# Patient Record
Sex: Female | Born: 1945 | Race: White | Hispanic: No | Marital: Married | Smoking: Never smoker
Health system: Southern US, Community
[De-identification: ages and names within clinical notes are randomized; demographics above are authoritative.]

## PROBLEM LIST (undated history)

## (undated) ENCOUNTER — Emergency Department (HOSPITAL_COMMUNITY): Payer: Medicare Other

## (undated) DIAGNOSIS — R51 Headache: Secondary | ICD-10-CM

## (undated) DIAGNOSIS — N189 Chronic kidney disease, unspecified: Secondary | ICD-10-CM

## (undated) DIAGNOSIS — K5730 Diverticulosis of large intestine without perforation or abscess without bleeding: Secondary | ICD-10-CM

## (undated) DIAGNOSIS — M1990 Unspecified osteoarthritis, unspecified site: Secondary | ICD-10-CM

## (undated) DIAGNOSIS — G4730 Sleep apnea, unspecified: Secondary | ICD-10-CM

## (undated) DIAGNOSIS — K219 Gastro-esophageal reflux disease without esophagitis: Secondary | ICD-10-CM

## (undated) DIAGNOSIS — G709 Myoneural disorder, unspecified: Secondary | ICD-10-CM

## (undated) DIAGNOSIS — J302 Other seasonal allergic rhinitis: Secondary | ICD-10-CM

## (undated) DIAGNOSIS — Z22322 Carrier or suspected carrier of Methicillin resistant Staphylococcus aureus: Secondary | ICD-10-CM

## (undated) HISTORY — PX: EYE SURGERY: SHX253

## (undated) HISTORY — PX: BACK SURGERY: SHX140

---

## 1966-07-24 HISTORY — PX: TUBAL LIGATION: SHX77

## 1998-07-24 HISTORY — PX: CHOLECYSTECTOMY: SHX55

## 2004-07-24 HISTORY — PX: ABDOMINAL HYSTERECTOMY: SHX81

## 2007-07-25 DIAGNOSIS — Z22322 Carrier or suspected carrier of Methicillin resistant Staphylococcus aureus: Secondary | ICD-10-CM | POA: Insufficient documentation

## 2007-07-25 HISTORY — PX: OTHER SURGICAL HISTORY: SHX169

## 2007-07-25 HISTORY — PX: COLON SURGERY: SHX602

## 2007-07-25 HISTORY — DX: Carrier or suspected carrier of methicillin resistant Staphylococcus aureus: Z22.322

## 2008-07-24 HISTORY — PX: COLOSTOMY CLOSURE: SHX1381

## 2008-07-24 HISTORY — PX: CERVICAL DISCECTOMY: SHX98

## 2009-04-19 ENCOUNTER — Inpatient Hospital Stay (HOSPITAL_COMMUNITY): Admission: RE | Admit: 2009-04-19 | Discharge: 2009-04-20 | Payer: Self-pay | Admitting: Neurosurgery

## 2009-06-15 ENCOUNTER — Encounter: Admission: RE | Admit: 2009-06-15 | Discharge: 2009-06-15 | Payer: Self-pay | Admitting: Neurosurgery

## 2009-12-22 HISTORY — PX: HERNIA REPAIR: SHX51

## 2010-07-24 HISTORY — PX: HERNIA REPAIR: SHX51

## 2010-10-28 LAB — CBC
MCV: 85.4 fL (ref 78.0–100.0)
Platelets: 294 10*3/uL (ref 150–400)
RBC: 4.1 MIL/uL (ref 3.87–5.11)
WBC: 8.5 10*3/uL (ref 4.0–10.5)

## 2010-10-28 LAB — DIFFERENTIAL
Eosinophils Absolute: 0.2 10*3/uL (ref 0.0–0.7)
Lymphocytes Relative: 29 % (ref 12–46)
Lymphs Abs: 2.4 10*3/uL (ref 0.7–4.0)
Monocytes Relative: 5 % (ref 3–12)
Neutro Abs: 5.5 10*3/uL (ref 1.7–7.7)
Neutrophils Relative %: 65 % (ref 43–77)

## 2010-10-28 LAB — ABO/RH: ABO/RH(D): A POS

## 2010-10-28 LAB — TYPE AND SCREEN: Antibody Screen: NEGATIVE

## 2011-07-28 ENCOUNTER — Encounter (HOSPITAL_COMMUNITY): Payer: Self-pay

## 2011-08-07 NOTE — Pre-Procedure Instructions (Signed)
20 Chelsea Gregory  08/07/2011   Your procedure is scheduled on: Monday, January 21 st.t  Report to [facility] at 9:00AM.   Call this number if you have problems the morning of surgery: [phone]   Remember:   Do not eat food:After Midnight.   May have clear liquids: up to 4 Hours before arrival. 5:00am  Clear liquids include soda, tea, black coffee, apple or grape juice, broth.  Take these medicines the morning of surgery with A SIP OF WATER:  Hydrocodone- Acetaminophen (Norco), Nitrofurantoin (Marodantin), Omeprazole (Prilosec)   Do not wear jewelry, make-up or nail polish.  Do not wear lotions, powders, or perfumes. You may wear deodorant.  Do not shave 48 hours prior to surgery.  Do not bring valuables to the hospital.  Contacts, dentures or bridgework may not be worn into surgery.  Leave suitcase in the car. After surgery it may be brought to your room.  For patients admitted to the hospital, checkout time is 11:00 AM the day of discharge.   Patients discharged the day of surgery will not be allowed to drive home.  Name and phone number of your driver: --  Special Instructions: CHG Shower Use Special Wash: 1/2 bottle night before surgery and 1/2 bottle morning of surgery.   Please read over the following fact sheets that you were given: Pain Booklet, Coughing and Deep Breathing, MRSA Information and Surgical Site Infection Prevention

## 2011-08-08 ENCOUNTER — Encounter (HOSPITAL_COMMUNITY)
Admission: RE | Admit: 2011-08-08 | Discharge: 2011-08-08 | Disposition: A | Discharge status: Home / Self Care | Payer: Medicare Other | Source: Ambulatory Visit | Attending: Neurosurgery | Admitting: Neurosurgery

## 2011-08-08 ENCOUNTER — Encounter (HOSPITAL_COMMUNITY): Payer: Self-pay

## 2011-08-08 HISTORY — DX: Other seasonal allergic rhinitis: J30.2

## 2011-08-08 HISTORY — DX: Carrier or suspected carrier of methicillin resistant Staphylococcus aureus: Z22.322

## 2011-08-08 HISTORY — DX: Headache: R51

## 2011-08-08 HISTORY — DX: Myoneural disorder, unspecified: G70.9

## 2011-08-08 HISTORY — DX: Diverticulosis of large intestine without perforation or abscess without bleeding: K57.30

## 2011-08-08 HISTORY — DX: Chronic kidney disease, unspecified: N18.9

## 2011-08-08 HISTORY — DX: Sleep apnea, unspecified: G47.30

## 2011-08-08 HISTORY — DX: Gastro-esophageal reflux disease without esophagitis: K21.9

## 2011-08-08 HISTORY — DX: Unspecified osteoarthritis, unspecified site: M19.90

## 2011-08-08 LAB — SURGICAL PCR SCREEN
MRSA, PCR: NEGATIVE
Staphylococcus aureus: NEGATIVE

## 2011-08-08 LAB — CBC
HCT: 37.1 % (ref 36.0–46.0)
Hemoglobin: 11.7 g/dL — ABNORMAL LOW (ref 12.0–15.0)
RBC: 4.41 MIL/uL (ref 3.87–5.11)
WBC: 8.4 10*3/uL (ref 4.0–10.5)

## 2011-08-11 NOTE — Progress Notes (Signed)
REQUESTED ORDERS

## 2011-08-14 ENCOUNTER — Encounter (HOSPITAL_COMMUNITY)
Admission: RE | Disposition: A | Discharge status: Home / Self Care | Payer: Self-pay | Source: Ambulatory Visit | Attending: Neurosurgery

## 2011-08-14 ENCOUNTER — Encounter (HOSPITAL_COMMUNITY): Payer: Self-pay | Admitting: *Deleted

## 2011-08-14 ENCOUNTER — Encounter (HOSPITAL_COMMUNITY): Payer: Self-pay | Admitting: Neurosurgery

## 2011-08-14 ENCOUNTER — Inpatient Hospital Stay (HOSPITAL_COMMUNITY)
Admission: RE | Admit: 2011-08-14 | Discharge: 2011-08-15 | DRG: 491 | Disposition: A | Discharge status: Home / Self Care | Payer: Medicare Other | Source: Ambulatory Visit | Attending: Neurosurgery | Admitting: Neurosurgery

## 2011-08-14 ENCOUNTER — Ambulatory Visit (HOSPITAL_COMMUNITY): Payer: Medicare Other

## 2011-08-14 ENCOUNTER — Ambulatory Visit (HOSPITAL_COMMUNITY): Payer: Medicare Other | Admitting: *Deleted

## 2011-08-14 DIAGNOSIS — Z8719 Personal history of other diseases of the digestive system: Secondary | ICD-10-CM

## 2011-08-14 DIAGNOSIS — Z888 Allergy status to other drugs, medicaments and biological substances status: Secondary | ICD-10-CM

## 2011-08-14 DIAGNOSIS — G4730 Sleep apnea, unspecified: Secondary | ICD-10-CM | POA: Diagnosis present

## 2011-08-14 DIAGNOSIS — G709 Myoneural disorder, unspecified: Secondary | ICD-10-CM | POA: Diagnosis present

## 2011-08-14 DIAGNOSIS — M47817 Spondylosis without myelopathy or radiculopathy, lumbosacral region: Secondary | ICD-10-CM | POA: Diagnosis present

## 2011-08-14 DIAGNOSIS — M48061 Spinal stenosis, lumbar region without neurogenic claudication: Secondary | ICD-10-CM | POA: Insufficient documentation

## 2011-08-14 DIAGNOSIS — Z90710 Acquired absence of both cervix and uterus: Secondary | ICD-10-CM

## 2011-08-14 DIAGNOSIS — K219 Gastro-esophageal reflux disease without esophagitis: Secondary | ICD-10-CM | POA: Diagnosis present

## 2011-08-14 DIAGNOSIS — N189 Chronic kidney disease, unspecified: Secondary | ICD-10-CM | POA: Diagnosis present

## 2011-08-14 DIAGNOSIS — M48062 Spinal stenosis, lumbar region with neurogenic claudication: Principal | ICD-10-CM | POA: Diagnosis present

## 2011-08-14 DIAGNOSIS — Z882 Allergy status to sulfonamides status: Secondary | ICD-10-CM

## 2011-08-14 DIAGNOSIS — J301 Allergic rhinitis due to pollen: Secondary | ICD-10-CM | POA: Diagnosis present

## 2011-08-14 HISTORY — DX: Spinal stenosis, lumbar region with neurogenic claudication: M48.062

## 2011-08-14 HISTORY — PX: LUMBAR LAMINECTOMY/DECOMPRESSION MICRODISCECTOMY: SHX5026

## 2011-08-14 HISTORY — DX: Spinal stenosis, lumbar region without neurogenic claudication: M48.061

## 2011-08-14 LAB — DIFFERENTIAL
Basophils Absolute: 0 10*3/uL (ref 0.0–0.1)
Eosinophils Absolute: 0.1 10*3/uL (ref 0.0–0.7)
Eosinophils Relative: 2 % (ref 0–5)

## 2011-08-14 LAB — TYPE AND SCREEN: ABO/RH(D): A POS

## 2011-08-14 SURGERY — LUMBAR LAMINECTOMY/DECOMPRESSION MICRODISCECTOMY
Anesthesia: General | Site: Back | Laterality: Left | Wound class: Clean

## 2011-08-14 MED ORDER — HYDROMORPHONE HCL PF 1 MG/ML IJ SOLN
INTRAMUSCULAR | Status: AC
  Filled 2011-08-14: qty 1

## 2011-08-14 MED ORDER — SODIUM CHLORIDE 0.9 % IJ SOLN
3.0000 mL | INTRAMUSCULAR | Status: DC | PRN
  Administered 2011-08-14: 3 mL via INTRAVENOUS

## 2011-08-14 MED ORDER — CYCLOBENZAPRINE HCL 10 MG PO TABS
10.0000 mg | ORAL_TABLET | Freq: Three times a day (TID) | ORAL | Status: DC | PRN
  Administered 2011-08-14: 10 mg via ORAL
  Filled 2011-08-14: qty 1

## 2011-08-14 MED ORDER — FENTANYL CITRATE 0.05 MG/ML IJ SOLN
INTRAMUSCULAR | Status: DC | PRN
  Administered 2011-08-14 (×2): 100 ug via INTRAVENOUS
  Administered 2011-08-14: 50 ug via INTRAVENOUS

## 2011-08-14 MED ORDER — SODIUM CHLORIDE 0.9 % IV SOLN
INTRAVENOUS | Status: AC
  Filled 2011-08-14: qty 500

## 2011-08-14 MED ORDER — THERA M PLUS PO TABS
1.0000 | ORAL_TABLET | Freq: Every day | ORAL | Status: DC
  Administered 2011-08-14 – 2011-08-15 (×2): 1 via ORAL
  Filled 2011-08-14 (×2): qty 1

## 2011-08-14 MED ORDER — ROCURONIUM BROMIDE 100 MG/10ML IV SOLN
INTRAVENOUS | Status: DC | PRN
  Administered 2011-08-14: 50 mg via INTRAVENOUS

## 2011-08-14 MED ORDER — LIDOCAINE HCL (CARDIAC) 20 MG/ML IV SOLN
INTRAVENOUS | Status: DC | PRN
  Administered 2011-08-14: 40 mg via INTRAVENOUS

## 2011-08-14 MED ORDER — HYDROMORPHONE HCL PF 1 MG/ML IJ SOLN: 0.5000 mg | INTRAMUSCULAR | Status: DC | PRN

## 2011-08-14 MED ORDER — ONDANSETRON HCL 4 MG/2ML IJ SOLN: 4.0000 mg | INTRAMUSCULAR | Status: DC | PRN

## 2011-08-14 MED ORDER — HYDROCODONE-ACETAMINOPHEN 5-325 MG PO TABS: 1.0000 | ORAL_TABLET | ORAL | Status: DC | PRN

## 2011-08-14 MED ORDER — MIDAZOLAM HCL 5 MG/5ML IJ SOLN
INTRAMUSCULAR | Status: DC | PRN
  Administered 2011-08-14: 2 mg via INTRAVENOUS

## 2011-08-14 MED ORDER — HEMOSTATIC AGENTS (NO CHARGE) OPTIME
TOPICAL | Status: DC | PRN
  Administered 2011-08-14: 1 via TOPICAL

## 2011-08-14 MED ORDER — CEFAZOLIN SODIUM 1-5 GM-% IV SOLN
1.0000 g | Freq: Three times a day (TID) | INTRAVENOUS | Status: AC
  Administered 2011-08-14 (×2): 1 g via INTRAVENOUS
  Filled 2011-08-14 (×4): qty 50

## 2011-08-14 MED ORDER — SODIUM CHLORIDE 0.9 % IR SOLN
Status: DC | PRN
  Administered 2011-08-14: 13:00:00

## 2011-08-14 MED ORDER — PANTOPRAZOLE SODIUM 40 MG PO TBEC
40.0000 mg | DELAYED_RELEASE_TABLET | Freq: Every day | ORAL | Status: DC
  Administered 2011-08-15: 40 mg via ORAL
  Filled 2011-08-14: qty 1

## 2011-08-14 MED ORDER — SODIUM CHLORIDE 0.9 % IJ SOLN
3.0000 mL | Freq: Two times a day (BID) | INTRAMUSCULAR | Status: DC
  Administered 2011-08-14: 3 mL via INTRAVENOUS

## 2011-08-14 MED ORDER — CEFAZOLIN SODIUM 1-5 GM-% IV SOLN
1.0000 g | INTRAVENOUS | Status: AC
  Administered 2011-08-14: 1 g via INTRAVENOUS
  Filled 2011-08-14: qty 50

## 2011-08-14 MED ORDER — PHENOL 1.4 % MT LIQD: 1.0000 | OROMUCOSAL | Status: DC | PRN

## 2011-08-14 MED ORDER — ALUM & MAG HYDROXIDE-SIMETH 200-200-20 MG/5ML PO SUSP: 30.0000 mL | Freq: Four times a day (QID) | ORAL | Status: DC | PRN

## 2011-08-14 MED ORDER — BACITRACIN 50000 UNITS IM SOLR
INTRAMUSCULAR | Status: AC
  Filled 2011-08-14: qty 1

## 2011-08-14 MED ORDER — ACETAMINOPHEN 650 MG RE SUPP: 650.0000 mg | RECTAL | Status: DC | PRN

## 2011-08-14 MED ORDER — ALENDRONATE SODIUM 70 MG PO TABS: 70.0000 mg | ORAL_TABLET | ORAL | Status: DC

## 2011-08-14 MED ORDER — VITAMIN D 1000 UNITS PO TABS
3000.0000 [IU] | ORAL_TABLET | Freq: Every day | ORAL | Status: DC
  Administered 2011-08-14 – 2011-08-15 (×2): 3000 [IU] via ORAL
  Filled 2011-08-14 (×2): qty 3

## 2011-08-14 MED ORDER — PHENYLEPHRINE HCL 10 MG/ML IJ SOLN
INTRAMUSCULAR | Status: DC | PRN
  Administered 2011-08-14 (×2): 80 ug via INTRAVENOUS
  Administered 2011-08-14 (×4): 40 ug via INTRAVENOUS

## 2011-08-14 MED ORDER — ONDANSETRON HCL 4 MG/2ML IJ SOLN: 4.0000 mg | Freq: Once | INTRAMUSCULAR | Status: DC | PRN

## 2011-08-14 MED ORDER — ALIGN PO CAPS: 1.0000 | ORAL_CAPSULE | Freq: Every day | ORAL | Status: DC

## 2011-08-14 MED ORDER — 0.9 % SODIUM CHLORIDE (POUR BTL) OPTIME
TOPICAL | Status: DC | PRN
  Administered 2011-08-14: 1000 mL

## 2011-08-14 MED ORDER — MENTHOL 3 MG MT LOZG: 1.0000 | LOZENGE | OROMUCOSAL | Status: DC | PRN

## 2011-08-14 MED ORDER — HYDROMORPHONE HCL PF 1 MG/ML IJ SOLN
0.2500 mg | INTRAMUSCULAR | Status: DC | PRN
  Administered 2011-08-14: 0.5 mg via INTRAVENOUS
  Administered 2011-08-14 (×4): 0.25 mg via INTRAVENOUS

## 2011-08-14 MED ORDER — THROMBIN 5000 UNITS EX KIT
PACK | CUTANEOUS | Status: DC | PRN
  Administered 2011-08-14 (×2): 5000 [IU] via TOPICAL

## 2011-08-14 MED ORDER — LACTATED RINGERS IV SOLN
INTRAVENOUS | Status: DC | PRN
  Administered 2011-08-14 (×2): via INTRAVENOUS

## 2011-08-14 MED ORDER — BUPIVACAINE HCL (PF) 0.25 % IJ SOLN
INTRAMUSCULAR | Status: DC | PRN
  Administered 2011-08-14: 30 mL

## 2011-08-14 MED ORDER — ACETAMINOPHEN 325 MG PO TABS: 650.0000 mg | ORAL_TABLET | ORAL | Status: DC | PRN

## 2011-08-14 MED ORDER — OXYCODONE-ACETAMINOPHEN 5-325 MG PO TABS
1.0000 | ORAL_TABLET | ORAL | Status: DC | PRN
  Administered 2011-08-14 – 2011-08-15 (×3): 2 via ORAL
  Filled 2011-08-14 (×3): qty 2

## 2011-08-14 MED ORDER — SODIUM CHLORIDE 0.9 % IV SOLN: 250.0000 mL | INTRAVENOUS | Status: DC

## 2011-08-14 MED ORDER — PROPOFOL 10 MG/ML IV EMUL
INTRAVENOUS | Status: DC | PRN
  Administered 2011-08-14: 200 mg via INTRAVENOUS

## 2011-08-14 MED ORDER — FAMOTIDINE 20 MG PO TABS
20.0000 mg | ORAL_TABLET | Freq: Every day | ORAL | Status: DC
  Administered 2011-08-14: 20 mg via ORAL
  Filled 2011-08-14 (×2): qty 1

## 2011-08-14 MED ORDER — NITROFURANTOIN MACROCRYSTAL 50 MG PO CAPS
50.0000 mg | ORAL_CAPSULE | Freq: Every day | ORAL | Status: DC
Start: 2011-08-14 — End: 2011-08-15
  Administered 2011-08-14: 50 mg via ORAL
  Filled 2011-08-14 (×2): qty 1

## 2011-08-14 MED ORDER — VITAMIN D3 25 MCG (1000 UNIT) PO TABS: 1000.0000 [IU] | ORAL_TABLET | Freq: Every day | ORAL | Status: DC

## 2011-08-14 MED ORDER — DEXAMETHASONE SODIUM PHOSPHATE 10 MG/ML IJ SOLN
10.0000 mg | Freq: Once | INTRAMUSCULAR | Status: AC
  Administered 2011-08-14: 10 mg via INTRAVENOUS
  Filled 2011-08-14: qty 1

## 2011-08-14 MED ORDER — NEOSTIGMINE METHYLSULFATE 1 MG/ML IJ SOLN
INTRAMUSCULAR | Status: DC | PRN
  Administered 2011-08-14: 4 mg via INTRAVENOUS

## 2011-08-14 MED ORDER — ONDANSETRON HCL 4 MG/2ML IJ SOLN
INTRAMUSCULAR | Status: DC | PRN
  Administered 2011-08-14: 4 mg via INTRAVENOUS

## 2011-08-14 MED ORDER — SACCHAROMYCES BOULARDII 250 MG PO CAPS
250.0000 mg | ORAL_CAPSULE | Freq: Two times a day (BID) | ORAL | Status: DC
  Administered 2011-08-14 – 2011-08-15 (×2): 250 mg via ORAL
  Filled 2011-08-14 (×3): qty 1

## 2011-08-14 MED ORDER — GLYCOPYRROLATE 0.2 MG/ML IJ SOLN
INTRAMUSCULAR | Status: DC | PRN
  Administered 2011-08-14: .7 mg via INTRAVENOUS

## 2011-08-14 MED ORDER — LORATADINE 10 MG PO TABS
10.0000 mg | ORAL_TABLET | Freq: Every day | ORAL | Status: DC
  Filled 2011-08-14 (×2): qty 1

## 2011-08-14 SURGICAL SUPPLY — 51 items
BAG DECANTER FOR FLEXI CONT (MISCELLANEOUS) ×2 IMPLANT
BENZOIN TINCTURE PRP APPL 2/3 (GAUZE/BANDAGES/DRESSINGS) ×2 IMPLANT
BLADE SURG ROTATE 9660 (MISCELLANEOUS) IMPLANT
BRUSH SCRUB EZ PLAIN DRY (MISCELLANEOUS) ×2 IMPLANT
BUR CUTTER 7.0 ROUND (BURR) ×2 IMPLANT
CANISTER SUCTION 2500CC (MISCELLANEOUS) ×2 IMPLANT
CLOTH BEACON ORANGE TIMEOUT ST (SAFETY) ×2 IMPLANT
CONT SPEC 4OZ CLIKSEAL STRL BL (MISCELLANEOUS) ×2 IMPLANT
DECANTER SPIKE VIAL GLASS SM (MISCELLANEOUS) ×2 IMPLANT
DERMABOND ADVANCED (GAUZE/BANDAGES/DRESSINGS) ×1
DERMABOND ADVANCED .7 DNX12 (GAUZE/BANDAGES/DRESSINGS) ×1 IMPLANT
DRAPE LAPAROTOMY 100X72X124 (DRAPES) ×2 IMPLANT
DRAPE MICROSCOPE ZEISS OPMI (DRAPES) IMPLANT
DRAPE POUCH INSTRU U-SHP 10X18 (DRAPES) ×2 IMPLANT
DRAPE PROXIMA HALF (DRAPES) IMPLANT
DRAPE SURG 17X23 STRL (DRAPES) ×4 IMPLANT
ELECT REM PT RETURN 9FT ADLT (ELECTROSURGICAL) ×2
ELECTRODE REM PT RTRN 9FT ADLT (ELECTROSURGICAL) ×1 IMPLANT
GAUZE SPONGE 4X4 16PLY XRAY LF (GAUZE/BANDAGES/DRESSINGS) IMPLANT
GLOVE BIOGEL PI IND STRL 7.0 (GLOVE) ×1 IMPLANT
GLOVE BIOGEL PI IND STRL 8 (GLOVE) ×1 IMPLANT
GLOVE BIOGEL PI INDICATOR 7.0 (GLOVE) ×1
GLOVE BIOGEL PI INDICATOR 8 (GLOVE) ×1
GLOVE ECLIPSE 7.5 STRL STRAW (GLOVE) ×4 IMPLANT
GLOVE ECLIPSE 8.5 STRL (GLOVE) ×2 IMPLANT
GLOVE EXAM NITRILE LRG STRL (GLOVE) IMPLANT
GLOVE EXAM NITRILE MD LF STRL (GLOVE) ×2 IMPLANT
GLOVE EXAM NITRILE XL STR (GLOVE) IMPLANT
GLOVE EXAM NITRILE XS STR PU (GLOVE) IMPLANT
GLOVE INDICATOR 7.5 STRL GRN (GLOVE) ×2 IMPLANT
GLOVE SURG SS PI 6.5 STRL IVOR (GLOVE) ×4 IMPLANT
GOWN BRE IMP SLV AUR LG STRL (GOWN DISPOSABLE) IMPLANT
GOWN BRE IMP SLV AUR XL STRL (GOWN DISPOSABLE) ×6 IMPLANT
GOWN STRL REIN 2XL LVL4 (GOWN DISPOSABLE) ×2 IMPLANT
KIT BASIN OR (CUSTOM PROCEDURE TRAY) ×2 IMPLANT
KIT ROOM TURNOVER OR (KITS) ×2 IMPLANT
NEEDLE HYPO 22GX1.5 SAFETY (NEEDLE) ×2 IMPLANT
NEEDLE SPNL 22GX3.5 QUINCKE BK (NEEDLE) ×2 IMPLANT
NS IRRIG 1000ML POUR BTL (IV SOLUTION) ×2 IMPLANT
PACK LAMINECTOMY NEURO (CUSTOM PROCEDURE TRAY) ×2 IMPLANT
PAD ARMBOARD 7.5X6 YLW CONV (MISCELLANEOUS) ×6 IMPLANT
RUBBERBAND STERILE (MISCELLANEOUS) ×4 IMPLANT
SPONGE GAUZE 4X4 12PLY (GAUZE/BANDAGES/DRESSINGS) ×2 IMPLANT
SPONGE SURGIFOAM ABS GEL SZ50 (HEMOSTASIS) ×2 IMPLANT
STRIP CLOSURE SKIN 1/2X4 (GAUZE/BANDAGES/DRESSINGS) ×2 IMPLANT
SUT VIC AB 2-0 CT1 18 (SUTURE) ×2 IMPLANT
SUT VIC AB 3-0 SH 8-18 (SUTURE) ×2 IMPLANT
SYR 20ML ECCENTRIC (SYRINGE) ×2 IMPLANT
TOWEL OR 17X24 6PK STRL BLUE (TOWEL DISPOSABLE) ×2 IMPLANT
TOWEL OR 17X26 10 PK STRL BLUE (TOWEL DISPOSABLE) ×2 IMPLANT
WATER STERILE IRR 1000ML POUR (IV SOLUTION) ×2 IMPLANT

## 2011-08-14 NOTE — Progress Notes (Signed)

## 2011-08-14 NOTE — Anesthesia Postprocedure Evaluation (Signed)
  Anesthesia Post-op Note  Patient: Chelsea Gregory  Procedure(s) Performed:  LUMBAR LAMINECTOMY/DECOMPRESSION MICRODISCECTOMY - Left Lumbar three-four, Bilateral Lumbar four-five decompressive lumbar laminectomy  Patient Location: PACU  Anesthesia Type: General  Level of Consciousness: oriented, sedated and patient cooperative  Airway and Oxygen Therapy: Patient Spontanous Breathing and Patient connected to nasal cannula oxygen  Post-op Pain: mild  Post-op Assessment: Post-op Vital signs reviewed, Patient's Cardiovascular Status Stable, Respiratory Function Stable, Patent Airway, No signs of Nausea or vomiting and Pain level controlled  Post-op Vital Signs: stable  Complications: No apparent anesthesia complications

## 2011-08-14 NOTE — Anesthesia Preprocedure Evaluation (Addendum)
Anesthesia Evaluation  Patient identified by MRN, date of birth, ID band Patient awake    Reviewed: Allergy & Precautions, H&P , NPO status , Patient's Chart, lab work & pertinent test results  History of Anesthesia Complications (+) AWARENESS UNDER ANESTHESIA  Airway Mallampati: II TM Distance: >3 FB Neck ROM: full    Dental  (+) Teeth Intact   Pulmonary sleep apnea ,          Cardiovascular Exercise Tolerance: Good regular Normal    Neuro/Psych  Headaches,  Neuromuscular disease Negative Psych ROS   GI/Hepatic Neg liver ROS, GERD-  ,  Endo/Other  Negative Endocrine ROS  Renal/GU negative Renal ROS  Genitourinary negative   Musculoskeletal   Abdominal   Peds  Hematology negative hematology ROS (+)   Anesthesia Other Findings   Reproductive/Obstetrics                          Anesthesia Physical Anesthesia Plan  ASA: II  Anesthesia Plan: General   Post-op Pain Management:    Induction: Intravenous  Airway Management Planned: Oral ETT  Additional Equipment:   Intra-op Plan:   Post-operative Plan: Extubation in OR  Informed Consent: I have reviewed the patients History and Physical, chart, labs and discussed the procedure including the risks, benefits and alternatives for the proposed anesthesia with the patient or authorized representative who has indicated his/her understanding and acceptance.     Plan Discussed with: CRNA, Anesthesiologist and Surgeon  Anesthesia Plan Comments:        Anesthesia Quick Evaluation

## 2011-08-14 NOTE — Transfer of Care (Signed)
Immediate Anesthesia Transfer of Care Note  Patient: Chelsea Gregory  Procedure(s) Performed:  LUMBAR LAMINECTOMY/DECOMPRESSION MICRODISCECTOMY - Left Lumbar three-four, Bilateral Lumbar four-five decompressive lumbar laminectomy  Patient Location: PACU  Anesthesia Type: General  Level of Consciousness: awake and alert   Airway & Oxygen Therapy: Patient Spontanous Breathing and Patient connected to nasal cannula oxygen  Post-op Assessment: Report given to PACU RN and Post -op Vital signs reviewed and stable  Post vital signs: Reviewed and stable  Complications: No apparent anesthesia complications

## 2011-08-14 NOTE — H&P (Signed)
Chelsea Gregory is an 66 y.o. female.   Chief Complaint: Back and bilateral lower extremity pain. HPI: 66 year old female with symptoms of back pain with radiation into her left and right lower extremities. Left lower extremity pain has been an ongoing problem for many months. Workup has demonstrated evidence of significant stenosis off to the left-sided L3-4. I she's been management anti-inflammatories she is rest activity modifications and epidural steroid injections that improvement. She began having right-sided L5 refer symptoms as well. Workup here demonstrates evidence of worsening stenosis bilaterally at L4-5. Patient presents now for left-sided L3-4 decompressive laminotomy and bilateral L4-5 decompressive laminotomies.  Past Medical History  Diagnosis Date  . Seasonal allergies   . Arthritis     Knees  . GERD (gastroesophageal reflux disease)   . Neuromuscular disorder     Cervical disc  . Sleep apnea     wear CPAP  tested > 3 years ago  . Headache     Spinal -post injestions  . Chronic kidney disease     kidney infection post colon surgery on Macrodantin  . Diverticular disease of left colon   . MRSA colonization 2009    Past Surgical History  Procedure Date  . Tubal ligation 1968  . Cholecystectomy 2000  . Abdominal hysterectomy 2006  . 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 Colosotmy 2009  . Vasectomy reversal   . Colostomy closure 2010  . Cervical discectomy 2010  . Hernia repair 12/2009    Right  . Hernia repair 2012    Left  . Eye surgery     Implant to tear duct    Family History  Problem Relation Age of Onset  . Anesthesia problems Neg Hx    Social History:  reports that she has never smoked. She does not have any smokeless tobacco history on file. She reports that she does not drink alcohol or use illicit drugs.  Allergies:  Allergies  Allergen Reactions  . Celebrex (Celecoxib) Other (See Comments)      Increase in blood pressure.  . Levaquin Nausea Only  . Phenergan Other (See Comments)    Right muscle spasm.  . Sulfa Drugs Cross Reactors Other (See Comments)    Hallucinations.  . Ciprofloxacin Rash  . Macrobid Rash    Medications Prior to Admission  Medication Dose Route Frequency Provider Last Rate Last Dose  . bacitracin 16109 UNITS injection           . ceFAZolin (ANCEF) IVPB 1 g/50 mL premix  1 g Intravenous 60 min Pre-Op Temple Pacini, MD      . dexamethasone (DECADRON) injection 10 mg  10 mg Intravenous Once Temple Pacini, MD      . HYDROmorphone (DILAUDID) injection 0.25-0.5 mg  0.25-0.5 mg Intravenous Q5 min PRN Rivka Barbara, MD      . ondansetron [facility]) injection 4 mg  4 mg Intravenous Once PRN Rivka Barbara, MD      . sodium chloride 0.9 % infusion            No current outpatient prescriptions on file as of 08/14/2011.    Results for orders placed during the hospital encounter of 08/14/11 (from the past 48 hour(s))  DIFFERENTIAL     Status: Normal   Collection Time   08/14/11  9:05 AM      Component Value Range Comment   Neutrophils Relative 59  43 -  77 (%)    Neutro Abs 3.9  1.7 - 7.7 (K/uL)    Lymphocytes Relative 33  12 - 46 (%)    Lymphs Abs 2.1  0.7 - 4.0 (K/uL)    Monocytes Relative 6  3 - 12 (%)    Monocytes Absolute 0.4  0.1 - 1.0 (K/uL)    Eosinophils Relative 2  0 - 5 (%)    Eosinophils Absolute 0.1  0.0 - 0.7 (K/uL)    Basophils Relative 1  0 - 1 (%)    Basophils Absolute 0.0  0.0 - 0.1 (K/uL)   TYPE AND SCREEN     Status: Normal   Collection Time   08/14/11  9:09 AM      Component Value Range Comment   ABO/RH(D) A POS      Antibody Screen NEG      Sample Expiration 08/17/2011      No results found.  Review of Systems  Constitutional: Negative.   HENT: Negative.   Eyes: Negative.   Respiratory: Negative.   Cardiovascular: Negative.   Gastrointestinal: Negative.   Genitourinary: Negative.   Musculoskeletal: Negative.    Skin: Negative.   Neurological: Negative.   Endo/Heme/Allergies: Negative.   Psychiatric/Behavioral: Negative.   All other systems reviewed and are negative.    Blood pressure 147/81, pulse 86, temperature 98 F (36.7 C), temperature source Oral, resp. rate 18, SpO2 99.00%. Physical Exam  Constitutional: She is oriented to person, place, and time. She appears well-developed and well-nourished.  HENT:  Head: Normocephalic and atraumatic.  Right Ear: External ear normal.  Left Ear: External ear normal.  Eyes: Conjunctivae and EOM are normal. Pupils are equal, round, and reactive to light.  Neck: Normal range of motion. Neck supple. No tracheal deviation present. No thyromegaly present.  Cardiovascular: Normal rate.   Respiratory: Effort normal. No respiratory distress. She has no wheezes.  GI: Soft. Bowel sounds are normal. She exhibits no distension. There is no tenderness.  Musculoskeletal: Normal range of motion. She exhibits no edema and no tenderness.  Neurological: She is alert and oriented to person, place, and time. She has normal reflexes. She displays normal reflexes. No cranial nerve deficit. She exhibits normal muscle tone. Coordination normal.  Skin: Skin is warm and dry. No rash noted. No erythema. No pallor.  Psychiatric: She has a normal mood and affect. Her behavior is normal. Judgment and thought content normal.     Assessment/Plan Lumbar stenosis with neurogenic claudication. Plan left L3-4 decompressive laminotomy and foraminotomies and bilateral L4-5 decompressive laminotomies and foraminotomies. Risks and benefits explained patient wishes proceed.  , A 08/14/2011, 11:27 AM

## 2011-08-14 NOTE — Preoperative (Signed)
Beta Blockers   Reason not to administer Beta Blockers:Not Applicable 

## 2011-08-14 NOTE — Brief Op Note (Signed)
08/14/2011  1:28 PM  PATIENT:  Chelsea Gregory  66 y.o. female  PRE-OPERATIVE DIAGNOSIS:  Lumbar three-four, four-five stenosis  POST-OPERATIVE DIAGNOSIS:  Lumbar three-four, four-five stenosis  PROCEDURE:  Procedure(s): LUMBAR LAMINECTOMY/DECOMPRESSION MICRODISCECTOMY  SURGEON:  Surgeon(s): Temple Pacini, MD Hewitt Shorts, MD  PHYSICIAN ASSISTANT:   ASSISTANTS: Shirlean Kelly   ANESTHESIA:   general  EBL:  Total I/O In: 1000 [I.V.:1000] Out: 50 [Blood:50]  BLOOD ADMINISTERED:none  DRAINS: none   LOCAL MEDICATIONS USED:  MARCAINE 20 CC  SPECIMEN:  No Specimen  DISPOSITION OF SPECIMEN:  N/A  COUNTS:  YES  TOURNIQUET:  * No tourniquets in log *  DICTATION: .Dragon Dictation  PLAN OF CARE: Admit to inpatient   PATIENT DISPOSITION:  PACU - hemodynamically stable.   Delay start of Pharmacological VTE agent (>24hrs) due to surgical blood loss or risk of bleeding:  {YES/

## 2011-08-14 NOTE — Op Note (Signed)
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 L4-5 stenosis with neurogenic claudication.  Postoperative diagnosis: Same  Procedure Name: Left L3-4 decompressive laminotomy with a left L3 and L4 decompressive foraminotomies.  Bilateral L4-5 decompressive laminotomies with bilateral L4 and L5 decompressive foraminotomies  Surgeon: A., M.D.  Asst. Surgeon: Shirlean Kelly   Anesthesia: General  Indication: 66 year old female with bilateral lower trimming a pain consistent with neurogenic claudication. Left-sided symptoms thought to be more consistent with an L4 radiculopathy right-sided symptoms more consistent with a right-sided L5 radiculopathy. Workup demonstrates evidence of spondylosis on the left at L3-4 and bilaterally at L4-5. Plan for a left L3 for decompression and bilateral L4-5 decompression in hopes of improving her symptoms.   Operative note: After induction of anesthesia, patient is turned prone onto a Wilson frame and appropriate padded. Lumbar regions prepped and draped sterilely. 10 blade used to make a linear skin incision overlying the L3-4-5 levels. This carried down sharply in the midline. Supper off Henderson Newcomer performed exposing the lamina and facet joints of L3-L4 and L5 on the left and the lamina and facet joints of L4 and L5 on the right. Deep self-retaining traction placed intraoperative x-rays taken level was confirmed. Laminotomies were then performed using high-speed drill and Kerrison rongeurs to be the inferior aspect the lamina above medial aspect of the facet joint in the superior rim the lamina below. Ligament flavum was elevated and resected piecemeal fashion using Kerrison rongeurs. Underlying thecal sac was identified. Starting first at L3-4 on the left side. Decompressive foraminotomies and form of course exiting L3 and L4 nerve roots. At this point a very  thorough depression achieved. The spaces exam and found to be flat wound is irrigated and Gelfoam was placed topically for hemostasis. The procedure then repeated bilaterally at L4-5 again without complication. At this point a very thorough depression achieved. There is a stinger to thecal sac and nerve roots. Wound is then irrigated one final time. Gelfoam was placed off for hemostasis. Wounds and close in layers with Vicryl sutures. Steri-Strips and sterile dressing were applied. There no provocation periventricular well and she returns to the recovery room postop.

## 2011-08-15 ENCOUNTER — Encounter (HOSPITAL_COMMUNITY): Payer: Self-pay | Admitting: Neurosurgery

## 2011-08-15 MED ORDER — CYCLOBENZAPRINE HCL 10 MG PO TABS: 10.0000 mg | ORAL_TABLET | Freq: Three times a day (TID) | ORAL | Status: AC | PRN

## 2011-08-15 MED ORDER — HYDROCODONE-ACETAMINOPHEN 5-325 MG PO TABS: 1.0000 | ORAL_TABLET | ORAL | Status: AC | PRN

## 2011-08-15 NOTE — Discharge Summary (Signed)
Physician Discharge Summary  Patient ID: Chelsea Gregory MRN: [id number] DOB/AGE: Mar 18, 1946 66 y.o.  Admit date: 08/14/2011 Discharge date: 08/15/2011  Admission Diagnoses:  Discharge Diagnoses:  Principal Problem:  *Spinal stenosis of lumbar region with neurogenic claudication   Discharged Condition: good  Hospital Course: Patient admitted the hospital where she underwent uncomplicated lumbar decompression. Postoperative she is done well. Back pain is minimal. She is having no lower extremity symptoms. Strength and sensation are intact. Plan for discharge home  Consults:   Significant Diagnostic Studies:   Treatments: Left L3-4 decompressive laminotomy, bilateral L4-5 decompressive laminotomies.  Discharge Exam: Blood pressure 105/63, pulse 97, temperature 97.7 F (36.5 C), temperature source Oral, resp. rate 20, SpO2 98.00%. Awake and alert oriented appropriate cranial nerve function is intact. Motor and sensory function of the extremities is normal. Wound healing well. Chest and abdomen benign.  Disposition:  Home  Medication List  As of 08/15/2011 10:47 AM   STOP taking these medications         HYDROcodone-acetaminophen 7.5-325 MG per tablet         TAKE these medications         alendronate 70 MG tablet   Commonly known as: FOSAMAX   Take 70 mg by mouth every 7 (seven) days. On Tuesday.      CALCIUM 600 + D PO   Take 1 tablet by mouth daily.      cetirizine 10 MG tablet   Commonly known as: ZYRTEC   Take 10 mg by mouth daily as needed.      cyclobenzaprine 10 MG tablet   Commonly known as: FLEXERIL   Take 1 tablet (10 mg total) by mouth 3 (three) times daily as needed for muscle spasms.      HYDROcodone-acetaminophen 5-325 MG per tablet   Commonly known as: NORCO   Take 1-2 tablets by mouth every 4 (four) hours as needed.      multivitamins ther. w/minerals Tabs   Take 1 tablet by mouth daily.      nitrofurantoin 50 MG capsule   Commonly known as:  MACRODANTIN   Take 50 mg by mouth daily.      omeprazole 20 MG capsule   Commonly known as: PRILOSEC   Take 20 mg by mouth daily.      TYLENOL ARTHRITIS PAIN PO   Take 1-2 tablets by mouth 2 (two) times daily as needed. For pain.      Vitamin D 2000 UNITS tablet   Take 2,000 Units by mouth daily.      cholecalciferol 1000 UNITS tablet   Commonly known as: VITAMIN D   Take 1,000 Units by mouth daily.         ASK your doctor about these medications         ALIGN PO   Take 1 capsule by mouth daily.      ranitidine 300 MG capsule   Commonly known as: ZANTAC   Take 300 mg by mouth every evening.           Follow-up Information    Follow up with , A, MD. Call in 1 week.   Contact information:   [address] [place] [address] [phone]          Signed: Temple Pacini 08/15/2011, 10:47 AM

## 2011-08-15 NOTE — Discharge Instructions (Signed)
Wound Care °Keep incision covered and dry for one week.  If you shower prior to then, cover incision with plastic wrap.  °You may remove outer bandage after one week and shower.  °Do not put any creams, lotions, or ointments on incision. °Leave steri-strips on neck.  They will fall off by themselves. °Activity °Walk each and every day, increasing distance each day. °No lifting greater than 5 lbs.  Avoid excessive neck motion. °No driving for 2 weeks; may ride as a passenger locally. °If provided with back brace, wear when out of bed.  It is not necessary to wear brace in bed. °Diet °Resume your normal diet.  °Return to Work °Will be discussed at you follow up appointment. °Call Your Doctor If Any of These Occur °Redness, drainage, or swelling at the wound.  °Temperature greater than 101 degrees. °Severe pain not relieved by pain medication. °Incision starts to come apart. °Follow Up Appt °Call today for appointment in 1-2 weeks ([phone]) or for problems.  If you have any hardware placed in your spine, you will need an x-ray before your appointment. °

## 2013-07-24 HISTORY — PX: JOINT REPLACEMENT: SHX530

## 2014-04-01 DIAGNOSIS — K5790 Diverticulosis of intestine, part unspecified, without perforation or abscess without bleeding: Secondary | ICD-10-CM | POA: Insufficient documentation

## 2014-04-01 DIAGNOSIS — M791 Myalgia, unspecified site: Secondary | ICD-10-CM | POA: Insufficient documentation

## 2014-04-01 DIAGNOSIS — J019 Acute sinusitis, unspecified: Secondary | ICD-10-CM

## 2014-04-01 DIAGNOSIS — G5600 Carpal tunnel syndrome, unspecified upper limb: Secondary | ICD-10-CM

## 2014-04-01 DIAGNOSIS — E559 Vitamin D deficiency, unspecified: Secondary | ICD-10-CM

## 2014-04-01 DIAGNOSIS — M8580 Other specified disorders of bone density and structure, unspecified site: Secondary | ICD-10-CM

## 2014-04-01 DIAGNOSIS — R1031 Right lower quadrant pain: Secondary | ICD-10-CM | POA: Insufficient documentation

## 2014-04-01 DIAGNOSIS — F419 Anxiety disorder, unspecified: Secondary | ICD-10-CM | POA: Insufficient documentation

## 2014-04-01 DIAGNOSIS — M1990 Unspecified osteoarthritis, unspecified site: Secondary | ICD-10-CM

## 2014-04-01 DIAGNOSIS — K449 Diaphragmatic hernia without obstruction or gangrene: Secondary | ICD-10-CM

## 2014-04-01 DIAGNOSIS — J310 Chronic rhinitis: Secondary | ICD-10-CM | POA: Insufficient documentation

## 2014-04-01 DIAGNOSIS — M5137 Other intervertebral disc degeneration, lumbosacral region: Secondary | ICD-10-CM | POA: Insufficient documentation

## 2014-04-01 DIAGNOSIS — M25569 Pain in unspecified knee: Secondary | ICD-10-CM | POA: Insufficient documentation

## 2014-04-01 DIAGNOSIS — G43909 Migraine, unspecified, not intractable, without status migrainosus: Secondary | ICD-10-CM | POA: Insufficient documentation

## 2014-04-01 DIAGNOSIS — G4730 Sleep apnea, unspecified: Secondary | ICD-10-CM | POA: Insufficient documentation

## 2014-04-01 DIAGNOSIS — M51379 Other intervertebral disc degeneration, lumbosacral region without mention of lumbar back pain or lower extremity pain: Secondary | ICD-10-CM

## 2014-04-01 HISTORY — DX: Chronic rhinitis: J31.0

## 2014-04-01 HISTORY — DX: Other specified disorders of bone density and structure, unspecified site: M85.80

## 2014-04-01 HISTORY — DX: Pain in unspecified knee: M25.569

## 2014-04-01 HISTORY — DX: Myalgia, unspecified site: M79.10

## 2014-04-01 HISTORY — DX: Other intervertebral disc degeneration, lumbosacral region: M51.37

## 2014-04-01 HISTORY — DX: Anxiety disorder, unspecified: F41.9

## 2014-04-01 HISTORY — DX: Carpal tunnel syndrome, unspecified upper limb: G56.00

## 2014-04-01 HISTORY — DX: Diaphragmatic hernia without obstruction or gangrene: K44.9

## 2014-04-01 HISTORY — DX: Vitamin D deficiency, unspecified: E55.9

## 2014-04-01 HISTORY — DX: Other intervertebral disc degeneration, lumbosacral region without mention of lumbar back pain or lower extremity pain: M51.379

## 2014-04-01 HISTORY — DX: Diverticulosis of intestine, part unspecified, without perforation or abscess without bleeding: K57.90

## 2014-04-01 HISTORY — DX: Migraine, unspecified, not intractable, without status migrainosus: G43.909

## 2014-04-01 HISTORY — DX: Acute sinusitis, unspecified: J01.90

## 2014-04-01 HISTORY — DX: Right lower quadrant pain: R10.31

## 2014-04-01 HISTORY — DX: Sleep apnea, unspecified: G47.30

## 2014-04-01 HISTORY — DX: Unspecified osteoarthritis, unspecified site: M19.90

## 2015-10-08 DIAGNOSIS — K219 Gastro-esophageal reflux disease without esophagitis: Secondary | ICD-10-CM | POA: Insufficient documentation

## 2015-10-08 DIAGNOSIS — R5381 Other malaise: Secondary | ICD-10-CM

## 2015-10-08 DIAGNOSIS — R5383 Other fatigue: Secondary | ICD-10-CM

## 2015-10-08 DIAGNOSIS — I10 Essential (primary) hypertension: Secondary | ICD-10-CM | POA: Insufficient documentation

## 2015-10-08 DIAGNOSIS — J4520 Mild intermittent asthma, uncomplicated: Secondary | ICD-10-CM

## 2015-10-08 DIAGNOSIS — E782 Mixed hyperlipidemia: Secondary | ICD-10-CM

## 2015-10-08 DIAGNOSIS — M159 Polyosteoarthritis, unspecified: Secondary | ICD-10-CM

## 2015-10-08 HISTORY — DX: Polyosteoarthritis, unspecified: M15.9

## 2015-10-08 HISTORY — DX: Mild intermittent asthma, uncomplicated: J45.20

## 2015-10-08 HISTORY — DX: Mixed hyperlipidemia: E78.2

## 2015-10-08 HISTORY — DX: Other malaise: R53.81

## 2015-10-08 HISTORY — DX: Gastro-esophageal reflux disease without esophagitis: K21.9

## 2015-10-08 HISTORY — DX: Essential (primary) hypertension: I10

## 2015-10-21 DIAGNOSIS — R109 Unspecified abdominal pain: Secondary | ICD-10-CM

## 2015-10-21 HISTORY — DX: Unspecified abdominal pain: R10.9

## 2016-02-01 ENCOUNTER — Other Ambulatory Visit: Payer: Self-pay | Admitting: Neurosurgery

## 2016-02-02 ENCOUNTER — Other Ambulatory Visit: Payer: Self-pay | Admitting: Neurosurgery

## 2016-02-02 DIAGNOSIS — M47816 Spondylosis without myelopathy or radiculopathy, lumbar region: Secondary | ICD-10-CM

## 2016-02-03 ENCOUNTER — Encounter: Payer: Self-pay | Admitting: Radiology

## 2016-02-03 ENCOUNTER — Ambulatory Visit
Admission: RE | Admit: 2016-02-03 | Discharge: 2016-02-03 | Disposition: A | Discharge status: Home / Self Care | Payer: Medicare Other | Source: Ambulatory Visit | Attending: Neurosurgery | Admitting: Neurosurgery

## 2016-02-03 DIAGNOSIS — M47816 Spondylosis without myelopathy or radiculopathy, lumbar region: Secondary | ICD-10-CM

## 2016-02-03 MED ORDER — METHYLPREDNISOLONE ACETATE 40 MG/ML INJ SUSP (RADIOLOG
120.0000 mg | Freq: Once | INTRAMUSCULAR | Status: AC
  Administered 2016-02-03: 120 mg via EPIDURAL

## 2016-02-03 MED ORDER — IOPAMIDOL (ISOVUE-M 200) INJECTION 41%
1.0000 mL | Freq: Once | INTRAMUSCULAR | Status: AC
  Administered 2016-02-03: 1 mL via EPIDURAL

## 2016-02-03 NOTE — Discharge Instructions (Signed)

## 2016-03-07 ENCOUNTER — Encounter (HOSPITAL_COMMUNITY)
Admission: RE | Admit: 2016-03-07 | Discharge: 2016-03-07 | Disposition: A | Discharge status: Home / Self Care | Payer: Medicare Other | Source: Ambulatory Visit | Attending: Neurosurgery | Admitting: Neurosurgery

## 2016-03-07 ENCOUNTER — Encounter (HOSPITAL_COMMUNITY): Payer: Self-pay

## 2016-03-07 ENCOUNTER — Other Ambulatory Visit (HOSPITAL_COMMUNITY): Payer: Self-pay | Admitting: *Deleted

## 2016-03-07 DIAGNOSIS — M479 Spondylosis, unspecified: Secondary | ICD-10-CM | POA: Diagnosis not present

## 2016-03-07 DIAGNOSIS — Z0183 Encounter for blood typing: Secondary | ICD-10-CM | POA: Diagnosis not present

## 2016-03-07 DIAGNOSIS — Z01812 Encounter for preprocedural laboratory examination: Secondary | ICD-10-CM | POA: Diagnosis not present

## 2016-03-07 LAB — BASIC METABOLIC PANEL
Anion gap: 8 (ref 5–15)
BUN: 13 mg/dL (ref 6–20)
CALCIUM: 9.6 mg/dL (ref 8.9–10.3)
CHLORIDE: 107 mmol/L (ref 101–111)
CO2: 24 mmol/L (ref 22–32)
CREATININE: 0.85 mg/dL (ref 0.44–1.00)
GFR calc Af Amer: >60 mL/min (ref >60)
GFR calc non Af Amer: >60 mL/min (ref >60)
Glucose, Bld: 109 mg/dL — ABNORMAL HIGH (ref 65–99)
Potassium: 3.9 mmol/L (ref 3.5–5.1)
SODIUM: 139 mmol/L (ref 135–145)

## 2016-03-07 LAB — CBC WITH DIFFERENTIAL/PLATELET
BASOS ABS: 0 10*3/uL (ref 0.0–0.1)
BASOS PCT: 0 %
EOS PCT: 2 %
Eosinophils Absolute: 0.1 10*3/uL (ref 0.0–0.7)
HEMATOCRIT: 40.1 % (ref 36.0–46.0)
Hemoglobin: 12.7 g/dL (ref 12.0–15.0)
Lymphocytes Relative: 32 %
Lymphs Abs: 2.5 10*3/uL (ref 0.7–4.0)
MCH: 29.5 pg (ref 26.0–34.0)
MCHC: 31.7 g/dL (ref 30.0–36.0)
MCV: 93 fL (ref 78.0–100.0)
MONO ABS: 0.4 10*3/uL (ref 0.1–1.0)
Monocytes Relative: 6 %
Neutro Abs: 4.8 10*3/uL (ref 1.7–7.7)
Neutrophils Relative %: 60 %
Platelets: 274 10*3/uL (ref 150–400)
RBC: 4.31 MIL/uL (ref 3.87–5.11)
RDW: 12.7 % (ref 11.5–15.5)
WBC: 7.8 10*3/uL (ref 4.0–10.5)

## 2016-03-07 LAB — TYPE AND SCREEN
ABO/RH(D): A POS
ANTIBODY SCREEN: NEGATIVE

## 2016-03-07 LAB — SURGICAL PCR SCREEN
MRSA, PCR: NEGATIVE
STAPHYLOCOCCUS AUREUS: NEGATIVE

## 2016-03-07 NOTE — Progress Notes (Signed)
[facility]  Dr. Blenda Nicelyhodri.

## 2016-03-07 NOTE — Pre-Procedure Instructions (Signed)
    Chelsea CoeCharlotte P Chappuis  03/07/2016      [id number] - [place], [place] - [address] [place] [id number] Phone: [phone] Fax: [phone]  [id number] [place], [place] - [address] [facility] [address] [place] [address] Phone: [phone] Fax: [phone]  [facility] [address] - [place], [place] - [address] [place] [address] Phone: [phone] Fax: [phone]    Your procedure is scheduled on 03-14-2016    Tuesday .  [facility].M.   Call this number if you have problems the morning of surgery:  [phone]   Remember:  Do not eat food or drink liquids after midnight.   Take these medicines the morning of surgery with A SIP OF WATER Tylenol,Flonase nasal spray,nitrofurantoin(macrodantin),omeprazole(Prilosec)        STOP ASPIRIN,ANTIINFLAMATORIES (IBUPROFEN,ALEVE,MOTRIN,ADVIL,GOODY'S POWDERS),HERBAL SUPPLEMENTS,FISH OIL,AND VITAMINS 5-7 DAYS PRIOR TO SURGERY   Do not wear jewelry, make-up or nail polish.  Do not wear lotions, powders, or perfumes.  You may not wear deoderant.  Do not shave 48 hours prior to surgery.    Do not bring valuables to the hospital.  [facility] is not responsible for any belongings or valuables.  Contacts, dentures or bridgework may not be worn into surgery.  Leave your suitcase in the car.  After surgery it may be brought to your room.  For patients admitted to the hospital, discharge time will be determined by your treatment team.  Patients discharged the day of surgery will not be allowed to drive home.   Special instructions:  See attached Sheet for instructions on CHG showers  Please read over the following fact sheets that you were given. MRSA Information and Surgical Site Infection Prevention

## 2016-03-14 ENCOUNTER — Inpatient Hospital Stay (HOSPITAL_COMMUNITY): Payer: Medicare Other | Admitting: Certified Registered"

## 2016-03-14 ENCOUNTER — Inpatient Hospital Stay (HOSPITAL_COMMUNITY)
Admission: RE | Admit: 2016-03-14 | Discharge: 2016-03-16 | DRG: 460 | Disposition: A | Discharge status: Home / Self Care | Payer: Medicare Other | Source: Ambulatory Visit | Attending: Neurosurgery | Admitting: Neurosurgery

## 2016-03-14 ENCOUNTER — Encounter (HOSPITAL_COMMUNITY)
Admission: RE | Disposition: A | Discharge status: Home / Self Care | Payer: Self-pay | Source: Ambulatory Visit | Attending: Neurosurgery

## 2016-03-14 ENCOUNTER — Encounter (HOSPITAL_COMMUNITY): Payer: Self-pay | Admitting: Urology

## 2016-03-14 ENCOUNTER — Inpatient Hospital Stay (HOSPITAL_COMMUNITY): Payer: Medicare Other

## 2016-03-14 DIAGNOSIS — J302 Other seasonal allergic rhinitis: Secondary | ICD-10-CM | POA: Diagnosis present

## 2016-03-14 DIAGNOSIS — Z419 Encounter for procedure for purposes other than remedying health state, unspecified: Secondary | ICD-10-CM

## 2016-03-14 DIAGNOSIS — Z6830 Body mass index (BMI) 30.0-30.9, adult: Secondary | ICD-10-CM

## 2016-03-14 DIAGNOSIS — Z981 Arthrodesis status: Secondary | ICD-10-CM

## 2016-03-14 DIAGNOSIS — M48061 Spinal stenosis, lumbar region without neurogenic claudication: Secondary | ICD-10-CM | POA: Diagnosis present

## 2016-03-14 DIAGNOSIS — M4806 Spinal stenosis, lumbar region: Principal | ICD-10-CM | POA: Diagnosis present

## 2016-03-14 DIAGNOSIS — Z881 Allergy status to other antibiotic agents status: Secondary | ICD-10-CM

## 2016-03-14 DIAGNOSIS — Z7951 Long term (current) use of inhaled steroids: Secondary | ICD-10-CM

## 2016-03-14 DIAGNOSIS — Z888 Allergy status to other drugs, medicaments and biological substances status: Secondary | ICD-10-CM

## 2016-03-14 DIAGNOSIS — M4180 Other forms of scoliosis, site unspecified: Secondary | ICD-10-CM | POA: Diagnosis present

## 2016-03-14 DIAGNOSIS — Z882 Allergy status to sulfonamides status: Secondary | ICD-10-CM

## 2016-03-14 DIAGNOSIS — Z792 Long term (current) use of antibiotics: Secondary | ICD-10-CM

## 2016-03-14 DIAGNOSIS — K219 Gastro-esophageal reflux disease without esophagitis: Secondary | ICD-10-CM | POA: Diagnosis present

## 2016-03-14 DIAGNOSIS — M549 Dorsalgia, unspecified: Secondary | ICD-10-CM | POA: Diagnosis present

## 2016-03-14 DIAGNOSIS — M5416 Radiculopathy, lumbar region: Secondary | ICD-10-CM | POA: Diagnosis present

## 2016-03-14 DIAGNOSIS — M5136 Other intervertebral disc degeneration, lumbar region: Secondary | ICD-10-CM | POA: Diagnosis present

## 2016-03-14 DIAGNOSIS — G4730 Sleep apnea, unspecified: Secondary | ICD-10-CM | POA: Diagnosis present

## 2016-03-14 DIAGNOSIS — Z791 Long term (current) use of non-steroidal anti-inflammatories (NSAID): Secondary | ICD-10-CM

## 2016-03-14 HISTORY — PX: ANTERIOR LAT LUMBAR FUSION: SHX1168

## 2016-03-14 HISTORY — DX: Spinal stenosis, lumbar region without neurogenic claudication: M48.061

## 2016-03-14 HISTORY — PX: LUMBAR PERCUTANEOUS PEDICLE SCREW 3 LEVEL: SHX5562

## 2016-03-14 SURGERY — ANTERIOR LATERAL LUMBAR FUSION 3 LEVELS
Anesthesia: General | Site: Spine Lumbar | Laterality: Left

## 2016-03-14 MED ORDER — SODIUM CHLORIDE 0.9% FLUSH
3.0000 mL | Freq: Two times a day (BID) | INTRAVENOUS | Status: DC
  Administered 2016-03-15 (×2): 3 mL via INTRAVENOUS

## 2016-03-14 MED ORDER — ALIGN PO CAPS: 1.0000 | ORAL_CAPSULE | Freq: Every day | ORAL | Status: DC

## 2016-03-14 MED ORDER — FENTANYL CITRATE (PF) 100 MCG/2ML IJ SOLN
INTRAMUSCULAR | Status: DC | PRN
  Administered 2016-03-14 (×4): 50 ug via INTRAVENOUS
  Administered 2016-03-14: 100 ug via INTRAVENOUS
  Administered 2016-03-14: 50 ug via INTRAVENOUS

## 2016-03-14 MED ORDER — SUGAMMADEX SODIUM 200 MG/2ML IV SOLN
INTRAVENOUS | Status: AC
  Filled 2016-03-14: qty 2

## 2016-03-14 MED ORDER — DEXAMETHASONE SODIUM PHOSPHATE 10 MG/ML IJ SOLN
INTRAMUSCULAR | Status: AC
  Filled 2016-03-14: qty 1

## 2016-03-14 MED ORDER — CEFAZOLIN SODIUM-DEXTROSE 2-4 GM/100ML-% IV SOLN
INTRAVENOUS | Status: AC
  Filled 2016-03-14: qty 100

## 2016-03-14 MED ORDER — DIAZEPAM 5 MG PO TABS
ORAL_TABLET | ORAL | Status: AC
  Filled 2016-03-14: qty 1

## 2016-03-14 MED ORDER — PHENOL 1.4 % MT LIQD: 1.0000 | OROMUCOSAL | Status: DC | PRN

## 2016-03-14 MED ORDER — MONTELUKAST SODIUM 10 MG PO TABS
10.0000 mg | ORAL_TABLET | Freq: Every day | ORAL | Status: DC
  Administered 2016-03-14 – 2016-03-15 (×2): 10 mg via ORAL
  Filled 2016-03-14 (×2): qty 1

## 2016-03-14 MED ORDER — SUGAMMADEX SODIUM 200 MG/2ML IV SOLN
INTRAVENOUS | Status: DC | PRN
  Administered 2016-03-14: 270 mg via INTRAVENOUS

## 2016-03-14 MED ORDER — HEMOSTATIC AGENTS (NO CHARGE) OPTIME
TOPICAL | Status: DC | PRN
  Administered 2016-03-14: 1 via TOPICAL

## 2016-03-14 MED ORDER — CEFAZOLIN IN D5W 1 GM/50ML IV SOLN
1.0000 g | Freq: Three times a day (TID) | INTRAVENOUS | Status: AC
  Administered 2016-03-14 – 2016-03-15 (×2): 1 g via INTRAVENOUS
  Filled 2016-03-14 (×2): qty 50

## 2016-03-14 MED ORDER — ONDANSETRON HCL 4 MG/2ML IJ SOLN
INTRAMUSCULAR | Status: AC
  Filled 2016-03-14: qty 2

## 2016-03-14 MED ORDER — FLUTICASONE PROPIONATE 50 MCG/ACT NA SUSP
2.0000 | Freq: Every day | NASAL | Status: DC
  Administered 2016-03-15: 2 via NASAL
  Filled 2016-03-14: qty 16

## 2016-03-14 MED ORDER — FENTANYL CITRATE (PF) 100 MCG/2ML IJ SOLN
INTRAMUSCULAR | Status: AC
  Filled 2016-03-14: qty 2

## 2016-03-14 MED ORDER — LIDOCAINE 2% (20 MG/ML) 5 ML SYRINGE
INTRAMUSCULAR | Status: AC
  Filled 2016-03-14: qty 5

## 2016-03-14 MED ORDER — SODIUM CHLORIDE 0.9% FLUSH: 3.0000 mL | INTRAVENOUS | Status: DC | PRN

## 2016-03-14 MED ORDER — MENTHOL 3 MG MT LOZG: 1.0000 | LOZENGE | OROMUCOSAL | Status: DC | PRN

## 2016-03-14 MED ORDER — ACETAMINOPHEN 650 MG RE SUPP: 650.0000 mg | RECTAL | Status: DC | PRN

## 2016-03-14 MED ORDER — VITAMIN D 1000 UNITS PO TABS
3000.0000 [IU] | ORAL_TABLET | Freq: Every day | ORAL | Status: DC
  Administered 2016-03-15: 3000 [IU] via ORAL
  Filled 2016-03-14: qty 3

## 2016-03-14 MED ORDER — PANTOPRAZOLE SODIUM 40 MG PO TBEC
40.0000 mg | DELAYED_RELEASE_TABLET | Freq: Every day | ORAL | Status: DC
  Administered 2016-03-14 – 2016-03-15 (×2): 40 mg via ORAL
  Filled 2016-03-14 (×2): qty 1

## 2016-03-14 MED ORDER — SODIUM CHLORIDE 0.9 % IR SOLN
Status: DC | PRN
  Administered 2016-03-14: 500 mL

## 2016-03-14 MED ORDER — PHENYLEPHRINE HCL 10 MG/ML IJ SOLN
INTRAMUSCULAR | Status: DC | PRN
  Administered 2016-03-14: 160 ug via INTRAVENOUS
  Administered 2016-03-14 (×2): 120 ug via INTRAVENOUS

## 2016-03-14 MED ORDER — LACTATED RINGERS IV SOLN
INTRAVENOUS | Status: DC
  Administered 2016-03-14 (×3): via INTRAVENOUS

## 2016-03-14 MED ORDER — MELOXICAM 7.5 MG PO TABS
7.5000 mg | ORAL_TABLET | Freq: Every day | ORAL | Status: DC
  Administered 2016-03-15: 7.5 mg via ORAL
  Filled 2016-03-14 (×2): qty 1

## 2016-03-14 MED ORDER — HYDROMORPHONE HCL 1 MG/ML IJ SOLN
INTRAMUSCULAR | Status: AC
  Filled 2016-03-14: qty 1

## 2016-03-14 MED ORDER — ONDANSETRON HCL 4 MG/2ML IJ SOLN: 4.0000 mg | INTRAMUSCULAR | Status: DC | PRN

## 2016-03-14 MED ORDER — PHENYLEPHRINE HCL 10 MG/ML IJ SOLN: INTRAMUSCULAR | Status: DC | PRN

## 2016-03-14 MED ORDER — ACETAMINOPHEN 325 MG PO TABS: 650.0000 mg | ORAL_TABLET | ORAL | Status: DC | PRN

## 2016-03-14 MED ORDER — FENTANYL CITRATE (PF) 100 MCG/2ML IJ SOLN
INTRAMUSCULAR | Status: AC
Start: 2016-03-14 — End: 2016-03-14
  Filled 2016-03-14: qty 2

## 2016-03-14 MED ORDER — ROCURONIUM BROMIDE 100 MG/10ML IV SOLN
INTRAVENOUS | Status: DC | PRN
  Administered 2016-03-14: 50 mg via INTRAVENOUS

## 2016-03-14 MED ORDER — VITAMIN D3 75 MCG (3000 UT) PO TABS: 3000.0000 [IU] | ORAL_TABLET | Freq: Every day | ORAL | Status: DC

## 2016-03-14 MED ORDER — CHLORHEXIDINE GLUCONATE CLOTH 2 % EX PADS: 6.0000 | MEDICATED_PAD | Freq: Once | CUTANEOUS | Status: DC

## 2016-03-14 MED ORDER — LOPERAMIDE HCL 2 MG PO TABS: 2.0000 mg | ORAL_TABLET | Freq: Every day | ORAL | Status: DC | PRN

## 2016-03-14 MED ORDER — PROPOFOL 1000 MG/100ML IV EMUL
INTRAVENOUS | Status: AC
  Filled 2016-03-14: qty 100

## 2016-03-14 MED ORDER — PROPOFOL 10 MG/ML IV BOLUS
INTRAVENOUS | Status: AC
  Filled 2016-03-14: qty 20

## 2016-03-14 MED ORDER — 0.9 % SODIUM CHLORIDE (POUR BTL) OPTIME
TOPICAL | Status: DC | PRN
  Administered 2016-03-14: 1000 mL

## 2016-03-14 MED ORDER — EPHEDRINE 5 MG/ML INJ
INTRAVENOUS | Status: AC
  Filled 2016-03-14: qty 10

## 2016-03-14 MED ORDER — PHENYLEPHRINE 40 MCG/ML (10ML) SYRINGE FOR IV PUSH (FOR BLOOD PRESSURE SUPPORT)
PREFILLED_SYRINGE | INTRAVENOUS | Status: AC
  Filled 2016-03-14: qty 10

## 2016-03-14 MED ORDER — DIAZEPAM 5 MG PO TABS
5.0000 mg | ORAL_TABLET | Freq: Four times a day (QID) | ORAL | Status: DC | PRN
  Administered 2016-03-14 – 2016-03-16 (×5): 5 mg via ORAL
  Filled 2016-03-14 (×4): qty 1

## 2016-03-14 MED ORDER — HYDROCODONE-ACETAMINOPHEN 5-325 MG PO TABS: 1.0000 | ORAL_TABLET | ORAL | Status: DC | PRN

## 2016-03-14 MED ORDER — LIDOCAINE HCL (CARDIAC) 20 MG/ML IV SOLN
INTRAVENOUS | Status: DC | PRN
  Administered 2016-03-14: 20 mg via INTRAVENOUS

## 2016-03-14 MED ORDER — DEXAMETHASONE SODIUM PHOSPHATE 10 MG/ML IJ SOLN
10.0000 mg | INTRAMUSCULAR | Status: AC
  Administered 2016-03-14: 10 mg via INTRAVENOUS

## 2016-03-14 MED ORDER — HYDROMORPHONE HCL 1 MG/ML IJ SOLN
0.5000 mg | INTRAMUSCULAR | Status: DC | PRN
  Administered 2016-03-14 (×2): 0.5 mg via INTRAVENOUS

## 2016-03-14 MED ORDER — HYDROMORPHONE HCL 1 MG/ML IJ SOLN
0.2500 mg | INTRAMUSCULAR | Status: DC | PRN
  Administered 2016-03-14 (×4): 0.5 mg via INTRAVENOUS

## 2016-03-14 MED ORDER — FAMOTIDINE 20 MG PO TABS
40.0000 mg | ORAL_TABLET | Freq: Two times a day (BID) | ORAL | Status: DC
  Administered 2016-03-14 – 2016-03-15 (×3): 40 mg via ORAL
  Filled 2016-03-14 (×3): qty 2

## 2016-03-14 MED ORDER — LOPERAMIDE HCL 2 MG PO CAPS
2.0000 mg | ORAL_CAPSULE | Freq: Every day | ORAL | Status: DC | PRN
  Filled 2016-03-14: qty 1

## 2016-03-14 MED ORDER — NITROFURANTOIN MACROCRYSTAL 50 MG PO CAPS
50.0000 mg | ORAL_CAPSULE | Freq: Every day | ORAL | Status: DC
  Administered 2016-03-14 – 2016-03-15 (×2): 50 mg via ORAL
  Filled 2016-03-14 (×3): qty 1

## 2016-03-14 MED ORDER — ROCURONIUM BROMIDE 10 MG/ML (PF) SYRINGE
PREFILLED_SYRINGE | INTRAVENOUS | Status: AC
  Filled 2016-03-14: qty 10

## 2016-03-14 MED ORDER — OXYCODONE-ACETAMINOPHEN 5-325 MG PO TABS
1.0000 | ORAL_TABLET | ORAL | Status: DC | PRN
  Administered 2016-03-14 – 2016-03-16 (×9): 2 via ORAL
  Filled 2016-03-14 (×9): qty 2

## 2016-03-14 MED ORDER — MEPERIDINE HCL 25 MG/ML IJ SOLN: 6.2500 mg | INTRAMUSCULAR | Status: DC | PRN

## 2016-03-14 MED ORDER — THROMBIN 5000 UNITS EX SOLR
CUTANEOUS | Status: DC | PRN
  Administered 2016-03-14 (×2): 5000 [IU] via TOPICAL

## 2016-03-14 MED ORDER — MIDAZOLAM HCL 2 MG/2ML IJ SOLN
0.5000 mg | Freq: Once | INTRAMUSCULAR | Status: AC | PRN
  Administered 2016-03-14: 0.5 mg via INTRAVENOUS

## 2016-03-14 MED ORDER — PROPOFOL 10 MG/ML IV BOLUS
INTRAVENOUS | Status: DC | PRN
  Administered 2016-03-14: 50 mg via INTRAVENOUS
  Administered 2016-03-14: 150 mg via INTRAVENOUS

## 2016-03-14 MED ORDER — RISAQUAD PO CAPS
1.0000 | ORAL_CAPSULE | Freq: Every day | ORAL | Status: DC
  Administered 2016-03-15: 1 via ORAL
  Filled 2016-03-14 (×2): qty 1

## 2016-03-14 MED ORDER — EPHEDRINE SULFATE 50 MG/ML IJ SOLN
INTRAMUSCULAR | Status: DC | PRN
  Administered 2016-03-14 (×2): 10 mg via INTRAVENOUS
  Administered 2016-03-14: 5 mg via INTRAVENOUS

## 2016-03-14 MED ORDER — PROPOFOL 500 MG/50ML IV EMUL
INTRAVENOUS | Status: DC | PRN
  Administered 2016-03-14: 50 ug/kg/min via INTRAVENOUS

## 2016-03-14 MED ORDER — CEFAZOLIN SODIUM-DEXTROSE 2-4 GM/100ML-% IV SOLN
2.0000 g | INTRAVENOUS | Status: AC
  Administered 2016-03-14: 2 g via INTRAVENOUS

## 2016-03-14 MED ORDER — HYDROMORPHONE HCL 1 MG/ML IJ SOLN
0.5000 mg | INTRAMUSCULAR | Status: DC | PRN
  Administered 2016-03-14 – 2016-03-15 (×3): 1 mg via INTRAVENOUS
  Filled 2016-03-14 (×3): qty 1

## 2016-03-14 MED ORDER — ONDANSETRON HCL 4 MG/2ML IJ SOLN
INTRAMUSCULAR | Status: DC | PRN
  Administered 2016-03-14: 4 mg via INTRAVENOUS

## 2016-03-14 SURGICAL SUPPLY — 76 items
BAG DECANTER FOR FLEXI CONT (MISCELLANEOUS) ×3 IMPLANT
BENZOIN TINCTURE PRP APPL 2/3 (GAUZE/BANDAGES/DRESSINGS) ×3 IMPLANT
BLADE CLIPPER SURG (BLADE) IMPLANT
BONE MATRIX OSTEOCEL PRO MED (Bone Implant) ×3 IMPLANT
BRUSH SCRUB EZ PLAIN DRY (MISCELLANEOUS) ×3 IMPLANT
CLOSURE WOUND 1/2 X4 (GAUZE/BANDAGES/DRESSINGS) ×2
CONT SPEC 4OZ CLIKSEAL STRL BL (MISCELLANEOUS) IMPLANT
COVER BACK TABLE 24X17X13 BIG (DRAPES) IMPLANT
COVER BACK TABLE 60X90IN (DRAPES) ×3 IMPLANT
DRAPE C-ARM 42X72 X-RAY (DRAPES) ×3 IMPLANT
DRAPE C-ARMOR (DRAPES) ×3 IMPLANT
DRAPE LAPAROTOMY 100X72X124 (DRAPES) ×3 IMPLANT
DRAPE POUCH INSTRU U-SHP 10X18 (DRAPES) IMPLANT
DRAPE SURG 17X23 STRL (DRAPES) ×12 IMPLANT
DRSG OPSITE POSTOP 3X4 (GAUZE/BANDAGES/DRESSINGS) ×3 IMPLANT
DRSG OPSITE POSTOP 4X6 (GAUZE/BANDAGES/DRESSINGS) ×6 IMPLANT
DURAPREP 26ML APPLICATOR (WOUND CARE) IMPLANT
ELECT BLADE 4.0 EZ CLEAN MEGAD (MISCELLANEOUS) ×3
ELECT BLADE 6.5 EXT (BLADE) IMPLANT
ELECT REM PT RETURN 9FT ADLT (ELECTROSURGICAL) ×3
ELECTRODE BLDE 4.0 EZ CLN MEGD (MISCELLANEOUS) ×1 IMPLANT
ELECTRODE REM PT RTRN 9FT ADLT (ELECTROSURGICAL) ×1 IMPLANT
EVACUATOR 1/8 PVC DRAIN (DRAIN) IMPLANT
GAUZE SPONGE 4X4 12PLY STRL (GAUZE/BANDAGES/DRESSINGS) ×3 IMPLANT
GAUZE SPONGE 4X4 16PLY XRAY LF (GAUZE/BANDAGES/DRESSINGS) IMPLANT
GLOVE BIOGEL PI IND STRL 7.0 (GLOVE) ×2 IMPLANT
GLOVE BIOGEL PI IND STRL 7.5 (GLOVE) ×1 IMPLANT
GLOVE BIOGEL PI IND STRL 8 (GLOVE) ×2 IMPLANT
GLOVE BIOGEL PI INDICATOR 7.0 (GLOVE) ×4
GLOVE BIOGEL PI INDICATOR 7.5 (GLOVE) ×2
GLOVE BIOGEL PI INDICATOR 8 (GLOVE) ×4
GLOVE ECLIPSE 7.0 STRL STRAW (GLOVE) ×3 IMPLANT
GLOVE ECLIPSE 9.0 STRL (GLOVE) ×6 IMPLANT
GLOVE EXAM NITRILE LRG STRL (GLOVE) IMPLANT
GLOVE EXAM NITRILE MD LF STRL (GLOVE) IMPLANT
GLOVE EXAM NITRILE XL STR (GLOVE) IMPLANT
GLOVE EXAM NITRILE XS STR PU (GLOVE) IMPLANT
GLOVE SURG SS PI 6.5 STRL IVOR (GLOVE) ×6 IMPLANT
GOWN STRL REUS W/ TWL LRG LVL3 (GOWN DISPOSABLE) ×3 IMPLANT
GOWN STRL REUS W/ TWL XL LVL3 (GOWN DISPOSABLE) ×1 IMPLANT
GOWN STRL REUS W/TWL 2XL LVL3 (GOWN DISPOSABLE) ×3 IMPLANT
GOWN STRL REUS W/TWL LRG LVL3 (GOWN DISPOSABLE) ×6
GOWN STRL REUS W/TWL XL LVL3 (GOWN DISPOSABLE) ×2
GUIDEWIRE NITINOL BEVEL TIP (WIRE) ×12 IMPLANT
IMPL COROENT LDTXL 10X18X55 (Intraocular Lens) ×1 IMPLANT
IMPL COROENT XL 8X18X50 (Intraocular Lens) ×1 IMPLANT
IMPLANT COROENT LDTXL 10X18X50 (Intraocular Lens) ×3 IMPLANT
IMPLANT COROENT LDTXL 10X18X55 (Intraocular Lens) ×3 IMPLANT
IMPLANT COROENT XL 8X18X50 (Intraocular Lens) ×3 IMPLANT
KIT BASIN OR (CUSTOM PROCEDURE TRAY) ×3 IMPLANT
KIT DILATOR XLIF 5 (KITS) ×1 IMPLANT
KIT ROOM TURNOVER OR (KITS) ×3 IMPLANT
KIT SURGICAL ACCESS MAXCESS 4 (KITS) ×3 IMPLANT
KIT XLIF (KITS) ×2
LIQUID BAND (GAUZE/BANDAGES/DRESSINGS) ×3 IMPLANT
MIX DBX 10CC 35% BONE (Bone Implant) ×3 IMPLANT
MODULE NVM5 NEXT GEN EMG (NEEDLE) ×3 IMPLANT
NEEDLE HYPO 22GX1.5 SAFETY (NEEDLE) ×3 IMPLANT
NEEDLE I-PASS III (NEEDLE) ×3 IMPLANT
NS IRRIG 1000ML POUR BTL (IV SOLUTION) ×3 IMPLANT
PACK LAMINECTOMY NEURO (CUSTOM PROCEDURE TRAY) ×3 IMPLANT
PUTTY BONE DBX 5CC MIX (Putty) ×3 IMPLANT
ROD RELINE MAS LORD 5.5X95MM (Rod) ×3 IMPLANT
SCREW LOCK RELINE 5.5 TULIP (Screw) ×12 IMPLANT
SCREW MAS RELINE POLY 6.5X40 (Screw) ×6 IMPLANT
SCREW RELINE MAS POLY 5.5X40 (Screw) ×6 IMPLANT
SPONGE LAP 4X18 X RAY DECT (DISPOSABLE) IMPLANT
SPONGE SURGIFOAM ABS GEL SZ50 (HEMOSTASIS) ×3 IMPLANT
STRIP CLOSURE SKIN 1/2X4 (GAUZE/BANDAGES/DRESSINGS) ×4 IMPLANT
SUT VIC AB 2-0 CT1 18 (SUTURE) ×6 IMPLANT
SUT VIC AB 3-0 SH 8-18 (SUTURE) ×9 IMPLANT
TAPE CLOTH 3X10 TAN LF (GAUZE/BANDAGES/DRESSINGS) ×9 IMPLANT
TOWEL OR 17X24 6PK STRL BLUE (TOWEL DISPOSABLE) ×3 IMPLANT
TOWEL OR 17X26 10 PK STRL BLUE (TOWEL DISPOSABLE) ×3 IMPLANT
TRAY FOLEY W/METER SILVER 16FR (SET/KITS/TRAYS/PACK) ×3 IMPLANT
WATER STERILE IRR 1000ML POUR (IV SOLUTION) ×3 IMPLANT

## 2016-03-14 NOTE — Anesthesia Procedure Notes (Signed)
Procedure Name: Intubation Date/Time: 03/14/2016 11:00 AM Performed by: Jefm MilesENNIE,  E Pre-anesthesia Checklist: Patient identified, Emergency Drugs available, Suction available, Patient being monitored and Timeout performed Patient Re-evaluated:Patient Re-evaluated prior to inductionOxygen Delivery Method: Circle system utilized Preoxygenation: Pre-oxygenation with 100% oxygen Intubation Type: IV induction Ventilation: Mask ventilation without difficulty Laryngoscope Size: Mac and 3 Grade View: Grade I Tube type: Oral Tube size: 7.0 mm Number of attempts: 1 Airway Equipment and Method: Stylet Placement Confirmation: ETT inserted through vocal cords under direct vision,  positive ETCO2 and breath sounds checked- equal and bilateral Secured at: 20 cm Tube secured with: Tape Dental Injury: Teeth and Oropharynx as per pre-operative assessment

## 2016-03-14 NOTE — OR Nursing (Signed)
Difficult to accomplish pain management vs apnea. Pt with labile analgesia response...unaffected by admin until threshold reached that pt is very sedated with apneic periods requiring much coaching for breathing.

## 2016-03-14 NOTE — Brief Op Note (Signed)
03/14/2016  2:35 PM  PATIENT:  Chelsea Gregory  70 y.o. female  PRE-OPERATIVE DIAGNOSIS:  Spondylosis  POST-OPERATIVE DIAGNOSIS:  spondylosis  PROCEDURE:  Procedure(s) with comments: Extreme Lumbar Interbody Fusion Left Lumbar Two-Three,Lumbar Three-four,Lumbar Four-Five with Pedicle Screws (Left) - left approach LUMBAR PERCUTANEOUS PEDICLE SCREW 3 LEVEL (Left)  SURGEON:  Surgeon(s) and Role:    * Julio SicksHenry , MD - Primary    * Lisbeth RenshawNeelesh Nundkumar, MD - Assisting  PHYSICIAN ASSISTANT:   ASSISTANTS:    ANESTHESIA:   general  EBL:  Total I/O In: 2000 [I.V.:2000] Out: 450 [Urine:400; Blood:50]  BLOOD ADMINISTERED:none  DRAINS: none   LOCAL MEDICATIONS USED:  MARCAINE     SPECIMEN:  No Specimen  DISPOSITION OF SPECIMEN:  N/A  COUNTS:  YES  TOURNIQUET:  * No tourniquets in log *  DICTATION: .Dragon Dictation  PLAN OF CARE: Admit to inpatient   PATIENT DISPOSITION:  PACU - hemodynamically stable.   Delay start of Pharmacological VTE agent (>24hrs) due to surgical blood loss or risk of bleeding: yes

## 2016-03-14 NOTE — Transfer of Care (Addendum)
Immediate Anesthesia Transfer of Care Note  Patient: Chelsea Gregory  Procedure(s) Performed: Procedure(s) with comments: Extreme Lumbar Interbody Fusion Left Lumbar Two-Three,Lumbar Three-four,Lumbar Four-Five with Pedicle Screws (Left) - left approach LUMBAR PERCUTANEOUS PEDICLE SCREW 3 LEVEL (Left)  Patient Location: PACU  Anesthesia Type:General  Level of Consciousness: lethargic and responds to stimulation  Airway & Oxygen Therapy: Patient Spontanous Breathing and Patient connected to nasal cannula oxygen  Post-op Assessment: Report given to RN and Patient moving all extremities X 4  Post vital signs: Reviewed and stable  Last Vitals:  Vitals:   03/14/16 0924  BP: (!) 168/78  Pulse: 72  Resp: 18  Temp: 36.8 C    Last Pain:  Vitals:   03/14/16 0928  TempSrc:   PainSc: 5          Complications: No apparent anesthesia complications

## 2016-03-14 NOTE — OR Nursing (Signed)
Versed 2 mg vial was pulled from pyxis from a "failed" drawer. Upon attempting to waste med, I learned that the versed I took had not been accounted for in the pyxis and could not waste in the computer. Have talked with Rx tech and she is informed of removal, discrepency created and now resolved. Versed 1.5 mg wasted w/ Richardo PriestMark Brande RN.

## 2016-03-14 NOTE — Progress Notes (Signed)
Orthopedic Tech Progress Note Patient Details:  Amado CoeCharlotte P Cripps 20-Mar-1946 [id number] Patient already has brace. Patient ID: Amado CoeCharlotte P Nitsch, female   DOB: 20-Mar-1946, 70 y.o.   MRN: [id number]   Jennye MoccasinHughes,  Craig 03/14/2016, 7:09 PM

## 2016-03-14 NOTE — Op Note (Signed)
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  Postoperative diagnosis: Same  Procedure Name: Left L2-3, L3-4, L4-5 anterior lateral retroperitoneal interbody indirect decompression and fusion utilizing interbody peek cages and morselized allograft with osteo-cell plus.  L2-L3 L4-L5 posterior percutaneous pedicle screw segmental fixation on the left  Surgeon: A., M.D.  Asst. Surgeon: Conchita ParisNundkumar  Anesthesia: General  Indication: 70 year old female with marked disc degeneration with disc space collapse and degenerative scoliosis using marked multilevel spondylosis and stenosis at L2-3, L3-4 and L4-5. Patient has failed conservative management. She presents now for three-level decompression and fusion in hopes of improving her symptoms.  Operative note: After induction of anesthesia, patient positioned in the right lateral decubitus position and a properly padded. Patient's secured to the bed. Bedframe was fixed in a flexed posture. Left flank and lumbar region were prepped and draped sterilely. An incision was made overlying the L3-4 disc space in the left flank and then a secondary incision was made in the left posterior flank. Using blunt dissection the secondary incision access was gained into the retroperitoneal space. Peroneal sac and contents worse swept forward. Transverse processes and lateral psoas muscle were notified. A dilator was then passed through the flank incision and docked into the L4-5 disc space. Neural stimulation was done throughout to avoid adjacent neural structures. Once a good docking place was established by both fluoroscopy and neural monitoring the dilators were then increased in size and then a self-retaining retractor was placed. The disc space was  fully uncovered and stimulated to confirm there were no traversing nerves from the lumbar plexus. The disc spaces then incised. Discectomy was then performed using various instruments. Contralateral Lee's was performed. Disc spaces and prepared for interbody fusion with multiple different curettes and scrapers. Soft tissue was removed and interspace. The disc space was sized to a 10 mm lordotic implant which was 55 mm wide. This was then packed with morselized allograft and osteo-cell plus. Guides were placed into the disc space and the cage was then impacted in a lateral position and confirmed to be in good position by both AP and lateral fluoroscopy. The retractor was removed. Images were once again performed confirming good position of the implant at the proper operative level. Procedure then repeated at L3-4 and L2-3 again using neural monitoring and again without complication. L34 cage was once again a 10 mm lordotic cage this cage was only 50 mm wide. Cage at L2-3 was a 8 mm standard by 50 mm. The wound was then irrigated. Hemostasis was good. Attention was then placed to the posterior lumbar region. Pedicles were identified with intraoperative fluoroscopy. Stab incisions were made overlying the pedicle of L2 L3 L4 and L5 on the left side. Jamshidi needle was then introduced into the pedicle at all 4 levels under fluoroscopic guidance using continuous neural monitoring. The pedicles at L2 and L3 were quite small but were cannulated well. Guidewires were placed into the pedicles at all 4 levels. The pedicles were then tapped again using neural stimulation and monitoring. Nuvasive percutaneous pedicle screws were then placed over the guidewires at L2 L3 L4 and L5. A pre-wart hours rod was then measured and passed through the screw heads. Locking caps were then placed into the screw heads using percutaneous towers. The locking caps were then secured in place and given a final  tightening. Final images revealed  good position of the implants at the proper upper level with much improved alignment of the spine and no apparent complications. All wounds were then irrigated with and like solution. Marcaine was infiltrated into the skin edges. Wounds are then closed in layers. Steri-Strips and sterile dressing were applied. No apparent complications. Patient tolerated the procedure well and she returns to the recovery room postop.

## 2016-03-14 NOTE — Anesthesia Preprocedure Evaluation (Addendum)
Anesthesia Evaluation  Patient identified by MRN, date of birth, ID band Patient awake    Reviewed: Allergy & Precautions, NPO status , Patient's Chart, lab work & pertinent test results  History of Anesthesia Complications Negative for: history of anesthetic complications  Airway Mallampati: II  TM Distance: >3 FB Neck ROM: Full    Dental  (+) Dental Advisory Given   Pulmonary sleep apnea and Continuous Positive Airway Pressure Ventilation , COPD,  COPD inhaler,    breath sounds clear to auscultation       Cardiovascular (-) anginanegative cardio ROS   Rhythm:Regular Rate:Normal     Neuro/Psych  Headaches, Anxiety Chronic back pain: tylenol    GI/Hepatic Neg liver ROS, GERD  Medicated and Controlled,  Endo/Other  Morbid obesity  Renal/GU negative Renal ROS     Musculoskeletal  (+) Arthritis , Osteoarthritis,    Abdominal (+) + obese,   Peds  Hematology negative hematology ROS (+)   Anesthesia Other Findings   Reproductive/Obstetrics                            Anesthesia Physical Anesthesia Plan  ASA: III  Anesthesia Plan: General   Post-op Pain Management:    Induction: Intravenous  Airway Management Planned: Oral ETT  Additional Equipment:   Intra-op Plan:   Post-operative Plan: Extubation in OR  Informed Consent: I have reviewed the patients History and Physical, chart, labs and discussed the procedure including the risks, benefits and alternatives for the proposed anesthesia with the patient or authorized representative who has indicated his/her understanding and acceptance.   Dental advisory given  Plan Discussed with: CRNA and Surgeon  Anesthesia Plan Comments: (Plan routine monitors, GETA)        Anesthesia Quick Evaluation

## 2016-03-14 NOTE — H&P (Signed)
Chelsea Gregory is an 70 y.o. female.   Chief Complaint: Back pain HPI: 70 year old female with chronic progressive back pain and intermittent radicular symptoms left greater than right. Workup demonstrates evidence of marked multilevel disc degeneration with degenerative scoliosis and severe neural foraminal stenosis at L2-3, L3-4 and L4-5. Patient has failed conservative management and presents now for left-sided L2-3, L3-4, L4-5 anterior lateral interbody decompression and fusion with interbody peek cage, morcellized allograft and augmented with posterior percutaneous pedicle screw fixation.  Past Medical History:  Diagnosis Date  . Arthritis    Knees  . Chronic kidney disease    kidney infection post colon surgery on Macrodantin  . Diverticular disease of left colon   . GERD (gastroesophageal reflux disease)   . Headache(784.0)    occasionally  migraines  . MRSA colonization 2009  . Neuromuscular disorder (HCC)    Cervical disc  . Seasonal allergies   . Sleep apnea    wear CPAP  tested > 3 years ago    Past Surgical History:  Procedure Laterality Date  . ABDOMINAL HYSTERECTOMY  2006  . BACK SURGERY    . CERVICAL DISCECTOMY  2010  . CHOLECYSTECTOMY  2000  . COLON SURGERY  2009   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, aso  had left ovary removed   . Colosotmy  2009  . COLOSTOMY CLOSURE  2010  . EYE SURGERY     Implant to tear duct  . HERNIA REPAIR  12/2009   Right  . HERNIA REPAIR  2012   Left  . JOINT REPLACEMENT Left 2015  . LUMBAR LAMINECTOMY/DECOMPRESSION MICRODISCECTOMY  08/14/2011   Procedure: LUMBAR LAMINECTOMY/DECOMPRESSION MICRODISCECTOMY;  Surgeon: Temple PaciniHenry A , MD;  Location: [facility];  Service: Neurosurgery;  Laterality: Left;  Left Lumbar three-four, Bilateral Lumbar four-five decompressive lumbar laminectomy  . TUBAL LIGATION  1968    Family History  Problem Relation Age of Onset  . Anesthesia problems Neg Hx    Social History:  reports that she has  never smoked. She has quit using smokeless tobacco. She reports that she does not drink alcohol or use drugs.  Allergies:  Allergies  Allergen Reactions  . Sulfa Antibiotics Other (See Comments)    Hallucinations  . Celebrex [Celecoxib] Other (See Comments)    Increase in blood pressure.  . Ciprofloxacin Rash  . Levofloxacin Nausea And Vomiting  . Metronidazole Nausea Only  . Promethazine Hcl Other (See Comments)    Right muscle spasm.    Medications Prior to Admission  Medication Sig Dispense Refill  . Acetaminophen (TYLENOL ARTHRITIS PAIN PO) Take 1-2 tablets by mouth 2 (two) times daily as needed. For pain.     Marland Kitchen. alendronate (FOSAMAX) 70 MG tablet Take 70 mg by mouth every Monday. Take with a full glass of water on an empty stomach.    . Calcium Carbonate-Vitamin D (CALCIUM 600 + D PO) Take 1 tablet by mouth daily. Reported on 02/03/2016    . Cholecalciferol (VITAMIN D3) 3000 units TABS Take 3,000 Units by mouth daily.     . fluticasone (FLONASE) 50 MCG/ACT nasal spray Place 2 sprays into both nostrils.    Marland Kitchen. glucose-Vitamin C 4-0.006 GM CHEW chewable tablet Chew 1 tablet by mouth.    . loperamide (IMODIUM A-D) 2 MG tablet Take 2 mg by mouth daily as needed for diarrhea or loose stools.    . meloxicam (MOBIC) 7.5 MG tablet Take 7.5 mg by mouth daily.     . montelukast (SINGULAIR) 10  MG tablet Take 10 mg by mouth at bedtime.     . Multiple Vitamins-Minerals (MULTIVITAMINS THER. W/MINERALS) TABS Take 1 tablet by mouth daily.      . nitrofurantoin (MACRODANTIN) 50 MG capsule Take 50 mg by mouth daily.     . Omega-3 Fatty Acids (FISH OIL) 1000 MG CAPS Take 1,000 mg by mouth daily.    Marland Kitchen. omeprazole (PRILOSEC) 20 MG capsule Take 20 mg by mouth daily.      . Probiotic Product (ALIGN PO) Take 1 capsule by mouth daily.      . ranitidine (ZANTAC) 300 MG capsule Take 300 mg by mouth every evening.        No results found for this or any previous visit (from the past 48 hour(s)). No results  found.  Pertinent items noted in HPI and remainder of comprehensive ROS otherwise negative.  Blood pressure (!) 168/78, pulse 72, temperature 98.2 F (36.8 C), temperature source Oral, resp. rate 18, height 4\' 10"  (1.473 m), weight 66.8 kg (147 lb 4.8 oz), SpO2 97 %.  Patient is awake and alert. She is oriented and appropriate. Speech is fluent. Judgment and insight are intact. Cranial nerve function normal. Motor examination upper extremities normal. Motor examination lower extremities normal aside from some very minimal dorsiflexion weakness on the left. Set fashion with mild decreased sensory loss in her left L4 and L5 dermatomes. Deep tendon reflexes with diminished left patellar reflex and absent Achilles reflexes bilaterally. No evidence of long track signs. Gait antalgic. Posture mildly flexed. Examination head ears eyes and throat is unremarkable. Chest and abdomen are benign. Extremities are free from injury deformity. Assessment/Plan L2-3, L3-4, L4-5 degenerative scoliosis with foraminal stenosis and intractable pain. Plan left L2-3, L3-4, L4-5 anterior lateral interbody decompression and fusion with interbody peek cage, morcellized allograft, and posterior percutaneous pedicle screw fixation from L2-L5. Risks and benefits of been explained. Patient wishes to proceed.  , A 03/14/2016, 10:30 AM

## 2016-03-15 ENCOUNTER — Encounter (HOSPITAL_COMMUNITY): Payer: Self-pay | Admitting: Neurosurgery

## 2016-03-15 NOTE — Anesthesia Postprocedure Evaluation (Signed)
Anesthesia Post Note  Patient: Chelsea Gregory  Procedure(s) Performed: Procedure(s) (LRB): Extreme Lumbar Interbody Fusion Left Lumbar Two-Three,Lumbar Three-four,Lumbar Four-Five with Pedicle Screws (Left) LUMBAR PERCUTANEOUS PEDICLE SCREW 3 LEVEL (Left)  Patient location during evaluation: PACU Anesthesia Type: General Level of consciousness: sedated, patient cooperative and oriented Pain management: pain level controlled (pain control improving) Vital Signs Assessment: post-procedure vital signs reviewed and stable Respiratory status: spontaneous breathing, nonlabored ventilation, respiratory function stable and patient connected to nasal cannula oxygen Cardiovascular status: blood pressure returned to baseline and stable Postop Assessment: no signs of nausea or vomiting Anesthetic complications: no Comments: Delayed entry, pt eval in PACU post op    Last Vitals:  Vitals:   03/15/16 0324 03/15/16 0725  BP: 111/73 131/74  Pulse: (!) 106 100  Resp: 20 18  Temp: 37.2 C     Last Pain:  Vitals:   03/15/16 0738  TempSrc:   PainSc: 6                  ,E. 

## 2016-03-15 NOTE — Evaluation (Signed)
Occupational Therapy Evaluation Patient Details Name: Chelsea CoeCharlotte P Mwangi MRN: [id number] DOB: 11-15-1945 Today's Date: 03/15/2016    History of Present Illness patient is a 70 yo female s/p Extreme Lumbar Interbody Fusion Left Lumbar Two-Three,Lumbar Three-four,Lumbar Four-Five with Pedicle Screws (Left).   Clinical Impression   PTA, pt was independent with ADLs and mobility. Pt currently requires min assist for seated LB ADLs and min guard assist for basic transfers due to LLE buckling and acute pain. Educated pt on back precautions, brace wear protocol, positioning for sleep, and compensatory strategies for LB ADLs. Pt plans to d/c home with 24/7 assistance from her husband. Pt will benefit from continued acute OT to increase independence and safety with ADLs and mobility to allow for safe discharge home. Recommend tub bench for home use.    Follow Up Recommendations  No OT follow up;Supervision/Assistance - 24 hour    Equipment Recommendations  Tub/shower bench    Recommendations for Other Services       Precautions / Restrictions Precautions Precautions: Back;Fall Precaution Booklet Issued: Yes (comment) Precaution Comments: Pt able to recall 2/3 back precautions at beginning of session. Reviewed precautions during functional activities and handout as well. Required Braces or Orthoses: Spinal Brace Spinal Brace: Lumbar corset;Applied in sitting position Restrictions Weight Bearing Restrictions: No      Mobility Bed Mobility Overal bed mobility: Modified Independent             General bed mobility comments: HOB flat, no use of bedrails. Increased time to perform, but no cues required for log roll technique.  Transfers Overall transfer level: Needs assistance Equipment used: Rolling walker (2 wheeled) Transfers: Sit to/from Stand Sit to Stand: Min guard         General transfer comment: Min guard for safety as pt experiencing 10/10 pain and observed L knee  buckling.     Balance Overall balance assessment: Needs assistance Sitting-balance support: No upper extremity supported;Feet supported Sitting balance-Leahy Scale: Fair Sitting balance - Comments: limited due to pain   Standing balance support: No upper extremity supported;During functional activity Standing balance-Leahy Scale: Fair Standing balance comment: able to maintain balance without UE support for static standing tasks                            ADL Overall ADL's : Needs assistance/impaired     Grooming: Wash/dry hands;Supervision/safety;Standing   Upper Body Bathing: Set up;Sitting   Lower Body Bathing: Minimal assistance;Sit to/from stand Lower Body Bathing Details (indicate cue type and reason): uanble to cross ankle-over-knee; educated on availability of long-handled sponge Upper Body Dressing : Set up;Sitting   Lower Body Dressing: Minimal assistance;Sit to/from stand Lower Body Dressing Details (indicate cue type and reason): unable to cross ankle-over-knee; husband can assist Toilet Transfer: Min guard;BSC;RW;Ambulation   Toileting- ArchitectClothing Manipulation and Hygiene: Min guard;Sit to/from stand Toileting - Clothing Manipulation Details (indicate cue type and reason): educated on use of wet wipes     Functional mobility during ADLs: Min guard;Rolling walker       Vision Vision Assessment?: No apparent visual deficits   Perception     Praxis      Pertinent Vitals/Pain Pain Assessment: 0-10 Pain Score: 10-Worst pain ever Pain Location: incision site/back Pain Descriptors / Indicators: Aching;Sore Pain Intervention(s): Limited activity within patient's tolerance;Monitored during session;Repositioned;Premedicated before session     Hand Dominance Right   Extremity/Trunk Assessment Upper Extremity Assessment Upper Extremity Assessment: Overall WFL for tasks  assessed   Lower Extremity Assessment Lower Extremity Assessment: Defer to PT  evaluation LLE Deficits / Details: LLE weakness/ pain from hx of arthritis   Cervical / Trunk Assessment Cervical / Trunk Assessment: Other exceptions Cervical / Trunk Exceptions: s/p lumbar surgery   Communication Communication Communication: No difficulties   Cognition Arousal/Alertness: Awake/alert Behavior During Therapy: WFL for tasks assessed/performed Overall Cognitive Status: Within Functional Limits for tasks assessed                     General Comments       Exercises       Shoulder Instructions      Home Living Family/patient expects to be discharged to:: Private residence Living Arrangements: Spouse/significant other Available Help at Discharge: Family;Available 24 hours/day Type of Home: House Home Access: Stairs to enter Entergy CorporationEntrance Stairs-Number of Steps: 6 Entrance Stairs-Rails: Right;Left Home Layout: One level     Bathroom Shower/Tub: Tub/shower unit Shower/tub characteristics: Engineer, building servicesCurtain Bathroom Toilet: Handicapped height     Home Equipment: None   Additional Comments: Pt has her mother-in-law's old tub bench, but reports that it doesn't fit their tub size. Pt's husband will make sure it is not usable before ordering new one.      Prior Functioning/Environment Level of Independence: Independent             OT Diagnosis: Generalized weakness;Acute pain   OT Problem List: Decreased strength;Decreased activity tolerance;Impaired balance (sitting and/or standing);Decreased safety awareness;Decreased knowledge of use of DME or AE;Decreased knowledge of precautions;Pain   OT Treatment/Interventions: Self-care/ADL training;Therapeutic exercise;DME and/or AE instruction;Therapeutic activities;Patient/family education;Balance training    OT Goals(Current goals can be found in the care plan section) Acute Rehab OT Goals Patient Stated Goal: to have less pain OT Goal Formulation: With patient Time For Goal Achievement: 03/29/16 Potential to  Achieve Goals: Good ADL Goals Pt Will Perform Lower Body Dressing: with modified independence;with adaptive equipment;sit to/from stand Pt Will Transfer to Toilet: with modified independence;ambulating;bedside commode (over toilet) Pt Will Perform Toileting - Clothing Manipulation and hygiene: with modified independence;sit to/from stand Pt Will Perform Tub/Shower Transfer: Tub transfer;with modified independence;ambulating;tub bench;rolling walker Additional ADL Goal #1: Pt will demonstrate adherence to 3/3 back precautions with min verbal cues.  OT Frequency: Min 2X/week   Barriers to D/C:            Co-evaluation              End of Session Equipment Utilized During Treatment: Gait belt;Rolling walker;Back brace Nurse Communication: Mobility status  Activity Tolerance: Patient limited by pain Patient left: in bed;with call bell/phone within reach;with family/visitor present   Time: [id number] OT Time Calculation (min): 23 min Charges:  OT General Charges $OT Visit: 1 Procedure OT Evaluation $OT Eval Moderate Complexity: 1 Procedure OT Treatments $Self Care/Home Management : 8-22 mins G-Codes:    Nils PyleJulia , OTR/L Pager: [phone] 03/15/2016, 10:49 AM

## 2016-03-15 NOTE — Progress Notes (Signed)
Postop day 1. Patient complains of back pain around her incision sites. No radicular pain. A little bit of soreness with left hip flexion as to be expected. No symptoms of numbness or radiating pain. No weakness.  Awake and alert. Oriented and appropriate. Vital signs are stable. She is afebrile. Urine output good. Motor and sensory exam intact. Abdomen soft.  Progressing well. Continue efforts at mobilization.

## 2016-03-15 NOTE — Evaluation (Signed)
Physical Therapy Evaluation Patient Details Name: Chelsea CoeCharlotte P Dennin MRN: [id number] DOB: January 04, 1946 Today's Date: 03/15/2016   History of Present Illness  patient is a 70 yo female s/p Extreme Lumbar Interbody Fusion Left Lumbar Two-Three,Lumbar Three-four,Lumbar Four-Five with Pedicle Screws (Left).  Clinical Impression  Patient seen for mobility assessment and education WU:[id number]:spinal surgery. Patient mobilizing well, performed stair negotiation, receptive to education re:car transfers and mobility expectations. No further Acute PT needs at this time. Will sign off.    Follow Up Recommendations No PT follow up    Equipment Recommendations  Rolling walker with 5" wheels    Recommendations for Other Services       Precautions / Restrictions Precautions Precautions: Back Precaution Booklet Issued: Yes (comment) Precaution Comments: reviewed with patient Required Braces or Orthoses: Spinal Brace Restrictions Weight Bearing Restrictions: No      Mobility  Bed Mobility Overal bed mobility: Modified Independent             General bed mobility comments: increased time to perform, demonstrates both activities (sit to sidelying and return to sitting)  Transfers Overall transfer level: Needs assistance Equipment used: None Transfers: Sit to/from Stand Sit to Stand: Supervision         General transfer comment: supervision for safety, increased time and effort (did not use RW during transfer)  Ambulation/Gait Ambulation/Gait assistance: Supervision Ambulation Distance (Feet): 210 Feet Assistive device: Rolling walker (2 wheeled) Gait Pattern/deviations: Step-through pattern;Decreased stride length;Antalgic Gait velocity: decreased   General Gait Details: steady with use of RW, able to ambulated in room without device. VCs for increased cadence  Stairs Stairs: Yes Stairs assistance: Supervision Stair Management: One rail Right;Step to pattern Number of Stairs:  6 General stair comments: VCs for sequencing and technique, no physical assist required  Wheelchair Mobility    Modified Rankin (Stroke Patients Only)       Balance Overall balance assessment: No apparent balance deficits (not formally assessed)                                           Pertinent Vitals/Pain Pain Assessment: 0-10 Pain Score: 9  Pain Location: low back, and left LE Pain Descriptors / Indicators: Discomfort;Grimacing;Guarding;Operative site guarding Pain Intervention(s): Monitored during session;Premedicated before session    Home Living Family/patient expects to be discharged to:: Private residence Living Arrangements: Spouse/significant other Available Help at Discharge: Family;Available 24 hours/day Type of Home: House Home Access: Stairs to enter Entrance Stairs-Rails: Doctor, general practiceight;Left Entrance Stairs-Number of Steps: 6 Home Layout: One level Home Equipment: Shower seat      Prior Function Level of Independence: Independent               Hand Dominance   Dominant Hand: Right    Extremity/Trunk Assessment   Upper Extremity Assessment: Overall WFL for tasks assessed           Lower Extremity Assessment: LLE deficits/detail   LLE Deficits / Details: LLE weakness/ pain from hx of arthritis  Cervical / Trunk Assessment:  (s/p spinal surgery)  Communication   Communication: No difficulties  Cognition Arousal/Alertness: Awake/alert Behavior During Therapy: WFL for tasks assessed/performed Overall Cognitive Status: Within Functional Limits for tasks assessed                      General Comments      Exercises  Assessment/Plan    PT Assessment Patent does not need any further PT services  PT Diagnosis Difficulty walking;Acute pain   PT Problem List    PT Treatment Interventions     PT Goals (Current goals can be found in the Care Plan section) Acute Rehab PT Goals PT Goal Formulation: All  assessment and education complete, DC therapy    Frequency     Barriers to discharge        Co-evaluation               End of Session Equipment Utilized During Treatment: Gait belt;Back brace Activity Tolerance: Patient tolerated treatment well Patient left: in chair;with call bell/phone within reach Nurse Communication: Mobility status;Precautions    Functional Assessment Tool Used: clinical judgement Functional Limitation: Mobility: Walking and moving around Mobility: Walking and Moving Around Current Status (W0981(G8978): At least 1 percent but less than 20 percent impaired, limited or restricted Mobility: Walking and Moving Around Goal Status [phone](G8979): At least 1 percent but less than 20 percent impaired, limited or restricted Mobility: Walking and Moving Around Discharge Status [phone](G8980): At least 1 percent but less than 20 percent impaired, limited or restricted    Time: 0801-0819 PT Time Calculation (min) (ACUTE ONLY): 18 min   Charges:   PT Evaluation $PT Eval Low Complexity: 1 Procedure     PT G Codes:   PT G-Codes **NOT FOR INPATIENT CLASS** Functional Assessment Tool Used: clinical judgement Functional Limitation: Mobility: Walking and moving around Mobility: Walking and Moving Around Current Status (O1308(G8978): At least 1 percent but less than 20 percent impaired, limited or restricted Mobility: Walking and Moving Around Goal Status [phone](G8979): At least 1 percent but less than 20 percent impaired, limited or restricted Mobility: Walking and Moving Around Discharge Status [phone](G8980): At least 1 percent but less than 20 percent impaired, limited or restricted    Fabio Asa,  J 03/15/2016, 8:27 AM Kenniyah Crumbevon , PT DPT  [phone]

## 2016-03-16 MED ORDER — DIAZEPAM 5 MG PO TABS: 5.0000 mg | ORAL_TABLET | Freq: Four times a day (QID) | ORAL | 0 refills | Status: DC | PRN

## 2016-03-16 MED ORDER — OXYCODONE-ACETAMINOPHEN 5-325 MG PO TABS: 1.0000 | ORAL_TABLET | ORAL | 0 refills | Status: DC | PRN

## 2016-03-16 NOTE — Discharge Summary (Signed)
Physician Discharge Summary  Patient ID: Chelsea CoeCharlotte P Chawla MRN: [id number] DOB/AGE: 09-17-1945 70 y.o.  Admit date: 03/14/2016 Discharge date: 03/16/2016  Admission Diagnoses:  Discharge Diagnoses:  Active Problems:   Lumbar foraminal stenosis   Discharged Condition: good  Hospital Course: Patient admitted to the hospital where she underwent an uncomplicated three-level lumbar decompression and fusion. Postoperatively she is progressing very well. Preoperative back and lower extremity pain improved. Standing and ambulating without difficulty. Ready for discharge home.  Consults:   Significant Diagnostic Studies:   Treatments:   Discharge Exam: Blood pressure 109/63, pulse 97, temperature 98.4 F (36.9 C), temperature source Oral, resp. rate 18, height 4\' 10"  (1.473 m), weight 66.8 kg (147 lb 4.8 oz), SpO2 97 %. Awake and alert. Oriented and appropriate. Cranial nerve function intact. Motor and sensory function extremities normal. Wound clean and dry. Chest and abdomen benign.  Disposition: 01-Home or Self Care     Medication List    TAKE these medications   alendronate 70 MG tablet Commonly known as:  FOSAMAX Take 70 mg by mouth every Monday. Take with a full glass of water on an empty stomach.   ALIGN PO Take 1 capsule by mouth daily.   CALCIUM 600 + D PO Take 1 tablet by mouth daily. Reported on 02/03/2016   diazepam 5 MG tablet Commonly known as:  VALIUM Take 1-2 tablets (5-10 mg total) by mouth every 6 (six) hours as needed for muscle spasms.   Fish Oil 1000 MG Caps Take 1,000 mg by mouth daily.   fluticasone 50 MCG/ACT nasal spray Commonly known as:  FLONASE Place 2 sprays into both nostrils.   glucose-Vitamin C 4-0.006 GM Chew chewable tablet Chew 1 tablet by mouth.   loperamide 2 MG tablet Commonly known as:  IMODIUM A-D Take 2 mg by mouth daily as needed for diarrhea or loose stools.   meloxicam 7.5 MG tablet Commonly known as:  MOBIC Take 7.5 mg  by mouth daily.   montelukast 10 MG tablet Commonly known as:  SINGULAIR Take 10 mg by mouth at bedtime.   multivitamins ther. w/minerals Tabs tablet Take 1 tablet by mouth daily.   nitrofurantoin 50 MG capsule Commonly known as:  MACRODANTIN Take 50 mg by mouth daily.   omeprazole 20 MG capsule Commonly known as:  PRILOSEC Take 20 mg by mouth daily.   oxyCODONE-acetaminophen 5-325 MG tablet Commonly known as:  PERCOCET/ROXICET Take 1-2 tablets by mouth every 4 (four) hours as needed for moderate pain.   ranitidine 300 MG capsule Commonly known as:  ZANTAC Take 300 mg by mouth every evening.   TYLENOL ARTHRITIS PAIN PO Take 1-2 tablets by mouth 2 (two) times daily as needed. For pain.   Vitamin D3 3000 units Tabs Take 3,000 Units by mouth daily.      Follow-up Information    Temple PaciniPOOL, A, MD .   Specialty:  Neurosurgery Contact information: [address] [place] [address] [phone]           Signed: Temple PaciniOOL, A 03/16/2016, 7:55 AM

## 2016-03-16 NOTE — Progress Notes (Signed)
Pt and husband given D/C instructions with Rx's, verbal understanding was provided. Pt's incision is covered with no sign of infection. Pt's IV was removed prior to D/C. Pt D/C'd home via wheelchair @ 0840 per MD order. Pt is stable @ D/C and has no other needs at this time. Rema FendtAshley , RN

## 2016-03-16 NOTE — Discharge Instructions (Signed)

## 2017-12-31 ENCOUNTER — Encounter: Payer: Self-pay | Admitting: Gastroenterology

## 2018-01-18 IMAGING — XA Imaging study
2 series · 2 of 2 positions shown · non-contrast
Comparison: none

CLINICAL DATA: Lumbosacral spondylosis without myelopathy.
Bilateral lower extremity radiculitis extending to the anterior
thighs. Left greater than right endplate change and subarticular
stenosis at L2-3.

[Series 1: ortho standard · 1 of 1 slices shown (1 of 2)]
[im 1/1]
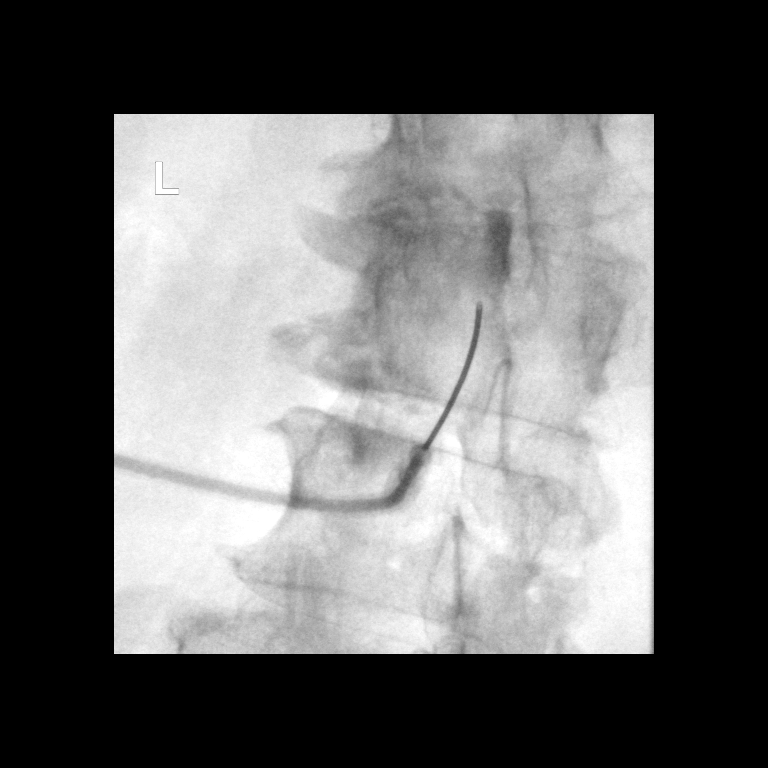

[Series 2: ortho standard · 1 of 1 slices shown (2 of 2)]
[im 1/1]
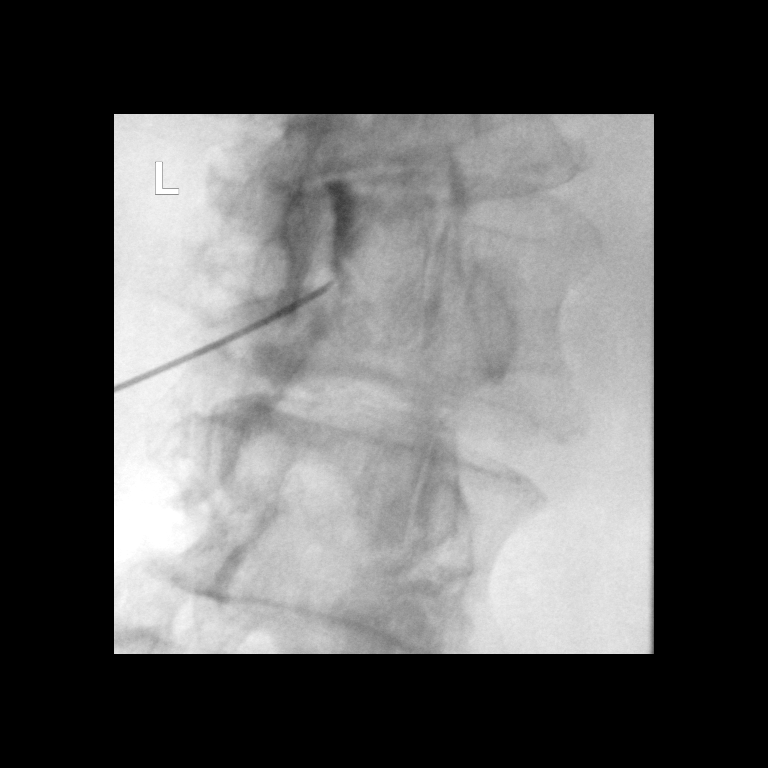

[2 of 2 positions shown; findings below may reference images not displayed]

FLUOROSCOPY TIME:  23.58 uGy*m2

PROCEDURE:
The procedure, risks, benefits, and alternatives were explained to
the patient. Questions regarding the procedure were encouraged and
answered. The patient understands and consents to the procedure.

LUMBAR EPIDURAL INJECTION:

An interlaminar approach was performed on left at L2-3. The
overlying skin was cleansed and anesthetized. A 20 gauge epidural
needle was advanced using loss-of-resistance technique.

DIAGNOSTIC EPIDURAL INJECTION:

Injection of Isovue-M 200 shows a good epidural pattern with spread
above and below the level of needle placement, primarily on the left
no vascular opacification is seen.

THERAPEUTIC EPIDURAL INJECTION:

120 mg of Depo-Medrol mixed with 3 mL 1% lidocaine were instilled.
The procedure was well-tolerated, and the patient was discharged
thirty minutes following the injection in good condition.

COMPLICATIONS:
None
IMPRESSION: Technically successful epidural injection on the left L2-3 # 1

## 2018-02-08 ENCOUNTER — Encounter: Payer: Self-pay | Admitting: Gastroenterology

## 2018-02-08 ENCOUNTER — Ambulatory Visit (INDEPENDENT_AMBULATORY_CARE_PROVIDER_SITE_OTHER): Payer: Medicare Other | Admitting: Gastroenterology

## 2018-02-08 VITALS — BP 126/72 | HR 64 | Ht <= 58 in | Wt 149.0 lb

## 2018-02-08 DIAGNOSIS — R197 Diarrhea, unspecified: Secondary | ICD-10-CM | POA: Diagnosis not present

## 2018-02-08 MED ORDER — CHOLESTYRAMINE 4 G PO PACK: PACK | ORAL | 5 refills | Status: DC

## 2018-02-08 NOTE — Patient Instructions (Signed)
If you are age 74 or older, your body mass index should be between 23-30. Your Body mass index is 31.14 kg/m. If this is out of the aforementioned range listed, please consider follow up with your Primary Care Provider.  If you are age 74 or younger, your body mass index should be between 19-25. Your Body mass index is 31.14 kg/m. If this is out of the aformentioned range listed, please consider follow up with your Primary Care Provider.   We have sent the following medications to your pharmacy for you to pick up at your convenience: Cholestyramine 4 Gram once daily.   Please call Dr. Donne HazelGuptas nurse Christen Bame(Pam Walsh, RN)  in 2 weeks at [phone]  to let her now how you are doing.    Thank you,  Dr. Lynann Bolognaajesh Gupta

## 2018-02-08 NOTE — Progress Notes (Signed)
Chief Complaint:   Referring Provider:  Gordan Payment., MD      ASSESSMENT AND PLAN;   #1.  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 Plan: - Please obtain previous records -last colonoscopy. -Trial of Cholestramine 4g po qd, 2 hours before or after rest of the medications.  She will also continue taking Imodium on as-needed basis as she is doing. - If still with problems, trial of Lomotil and/or Bentyl.  We are awaiting Bentyl since patient has previous history of dizziness. - Reduce omeprazole to 20 mg p.o. every other day. - Pt to call in 2 weeks, even if she is better. - RTC 12 weeks. If still with problems, will consider further work-up.   HPI:    Chelsea Gregory is a 72 y.o. female  Diarrhea 3-4/day, with imodium 3/day Worse after eating salads With associated some abdominal bloating. No abdominal pain. No nocturnal symptoms Colon 2017 Dr Maryruth Bun - negative Neg stool studies. No weight loss Had problems over the last 15 to 20 years, got worse after colon resection No melena or hematochezia No red flag symptoms Denies having any upper GI symptoms including nausea, vomiting, dysphagia or odynophagia.  She does take omeprazole ever since her gallbladder has been taken out.   Past Medical History:  Diagnosis Date  . Arthritis    Knees  . Chronic kidney disease    kidney infection post colon surgery on Macrodantin  . Diverticular disease of left colon   . GERD (gastroesophageal reflux disease)   . Headache(784.0)    occasionally  migraines  . MRSA colonization 2009  . Neuromuscular disorder (HCC)    Cervical disc  . Seasonal allergies   . Sleep apnea    wear CPAP  tested > 3 years ago    Past Surgical History:  Procedure Laterality Date  . ABDOMINAL HYSTERECTOMY  2006  . ANTERIOR LAT LUMBAR FUSION Left  03/14/2016   Procedure: Extreme Lumbar Interbody Fusion Left Lumbar Two-Three,Lumbar Three-four,Lumbar Four-Five with Pedicle Screws;  Surgeon: Julio Sicks, MD;  Location: [facility];  Service: Neurosurgery;  Laterality: Left;  left approach  . BACK SURGERY    . CERVICAL DISCECTOMY  2010  . CHOLECYSTECTOMY  2000  . COLON SURGERY  2009   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, aso  had left ovary removed   . Colosotmy  2009  . COLOSTOMY CLOSURE  2010  . EYE SURGERY     Implant to tear duct  . HERNIA REPAIR  12/2009   Right  . HERNIA REPAIR  2012   Left  . JOINT REPLACEMENT Left 2015  . LUMBAR LAMINECTOMY/DECOMPRESSION MICRODISCECTOMY  08/14/2011   Procedure: LUMBAR LAMINECTOMY/DECOMPRESSION MICRODISCECTOMY;  Surgeon: Temple Pacini, MD;  Location: [facility];  Service: Neurosurgery;  Laterality: Left;  Left Lumbar three-four, Bilateral Lumbar four-five decompressive lumbar laminectomy  . LUMBAR PERCUTANEOUS PEDICLE SCREW 3 LEVEL Left 03/14/2016   Procedure: LUMBAR PERCUTANEOUS PEDICLE SCREW 3 LEVEL;  Surgeon: Julio Sicks, MD;  Location: [facility];  Service: Neurosurgery;  Laterality: Left;  . TUBAL LIGATION  1968    Family History  Problem Relation Age of Onset  . Anesthesia problems Neg Hx   . Colon cancer Neg Hx     Social History   Tobacco Use  . Smoking status: Never Smoker  . Smokeless tobacco:  Former NeurosurgeonUser  Substance Use Topics  . Alcohol use: No  . Drug use: No    Current Outpatient Medications  Medication Sig Dispense Refill  . Acetaminophen (TYLENOL ARTHRITIS PAIN PO) Take 1-2 tablets by mouth 2 (two) times daily as needed. For pain.     Marland Kitchen. alendronate (FOSAMAX) 70 MG tablet Take 70 mg by mouth every Monday. Take with a full glass of water on an empty stomach.    . Cholecalciferol (VITAMIN D3) 3000 units TABS Take 3,000 Units by mouth daily.     . fluticasone (FLONASE) 50 MCG/ACT nasal spray Place 2 sprays into both nostrils.    Marland Kitchen. loperamide (IMODIUM A-D) 2 MG tablet Take  2 mg by mouth daily as needed for diarrhea or loose stools.    . meloxicam (MOBIC) 7.5 MG tablet Take 7.5 mg by mouth daily.     . montelukast (SINGULAIR) 10 MG tablet Take 10 mg by mouth at bedtime.     . Multiple Vitamins-Minerals (MULTIVITAMINS THER. W/MINERALS) TABS Take 1 tablet by mouth daily.      . nitrofurantoin (MACRODANTIN) 50 MG capsule Take 50 mg by mouth daily.     Marland Kitchen. omeprazole (PRILOSEC) 20 MG capsule Take 20 mg by mouth daily.      . Probiotic Product (ALIGN PO) Take 1 capsule by mouth daily.      . ranitidine (ZANTAC) 300 MG capsule Take 300 mg by mouth every evening.       No current facility-administered medications for this visit.     Allergies  Allergen Reactions  . Sulfa Antibiotics Other (See Comments)    Hallucinations  . Celebrex [Celecoxib] Other (See Comments)    Increase in blood pressure.  . Ciprofloxacin Rash  . Levofloxacin Nausea And Vomiting  . Metronidazole Nausea Only  . Promethazine Hcl Other (See Comments)    Right muscle spasm.    Review of Systems:  Constitutional: Denies fever, chills, diaphoresis, appetite change and fatigue.  HEENT: Denies photophobia, eye pain, redness, hearing loss, ear pain, congestion, sore throat, rhinorrhea, sneezing, mouth sores, neck pain, neck stiffness and tinnitus.   Respiratory: Denies SOB, DOE, cough, chest tightness,  and wheezing.   Cardiovascular: Denies chest pain, palpitations and leg swelling.  Genitourinary: Denies dysuria, urgency, frequency, hematuria, flank pain and difficulty urinating.  Musculoskeletal: Denies myalgias, back pain, joint swelling, arthralgias and gait problem.  Skin: No rash.  Neurological: Denies dizziness, seizures, syncope, weakness, light-headedness, numbness and headaches.  Hematological: Denies adenopathy. Easy bruising, personal or family bleeding history  Psychiatric/Behavioral: No anxiety or depression     Physical Exam:    BP 126/72   Pulse 64   Ht 4\' 10"  (1.473 m)    Wt 149 lb (67.6 kg)   BMI 31.14 kg/m  Filed Weights   02/08/18 1041  Weight: 149 lb (67.6 kg)   Constitutional:  Well-developed, in no acute distress. Psychiatric: Normal mood and affect. Behavior is normal. HEENT: Pupils normal.  Conjunctivae are normal. No scleral icterus. Neck supple.  Cardiovascular: Normal rate, regular rhythm. No edema Pulmonary/chest: Effort normal and breath sounds normal. No wheezing, rales or rhonchi. Abdominal: Soft, nondistended. Nontender. Bowel sounds active throughout. There are no masses palpable. No hepatomegaly.  Multiple well-healed scars. Rectal:  defered Neurological: Alert and oriented to person place and time. Skin: Skin is warm and dry. No rashes noted.  Data Reviewed: I have personally reviewed following labs and imaging studies  CBC: CBC Latest Ref Rng & Units 03/07/2016 08/08/2011 04/14/2009  WBC 4.0 - 10.5 K/uL 7.8 8.4 8.5  Hemoglobin 12.0 - 15.0 g/dL 40.9 11.7(L) 11.6(L)  Hematocrit 36.0 - 46.0 % 40.1 37.1 35.0(L)  Platelets 150 - 400 K/uL 274 286 294    CMP: CMP Latest Ref Rng & Units 03/07/2016  Glucose 65 - 99 mg/dL 811(B)  BUN 6 - 20 mg/dL 13  Creatinine 1.47 - 8.29 mg/dL 5.62  Sodium 130 - 865 mmol/L 139  Potassium 3.5 - 5.1 mmol/L 3.9  Chloride 101 - 111 mmol/L 107  CO2 22 - 32 mmol/L 24  Calcium 8.9 - 10.3 mg/dL 9.6   Seen in presence of patient's husband.   Edman Circle, MD 02/08/2018, 10:48 AM  Cc: Gordan Payment., MD

## 2018-04-19 IMAGING — RF DG LUMBAR SPINE 2-3V
1 series · 2 of 2 positions shown · non-contrast
Comparison: None.

CLINICAL DATA: Multilevel lumbar fusion

EXAM:
DG C-ARM GT 120 MIN; LUMBAR SPINE - 2-3 VIEW

[Series 1: run · 2 of 2 slices shown]
[im 1/2]
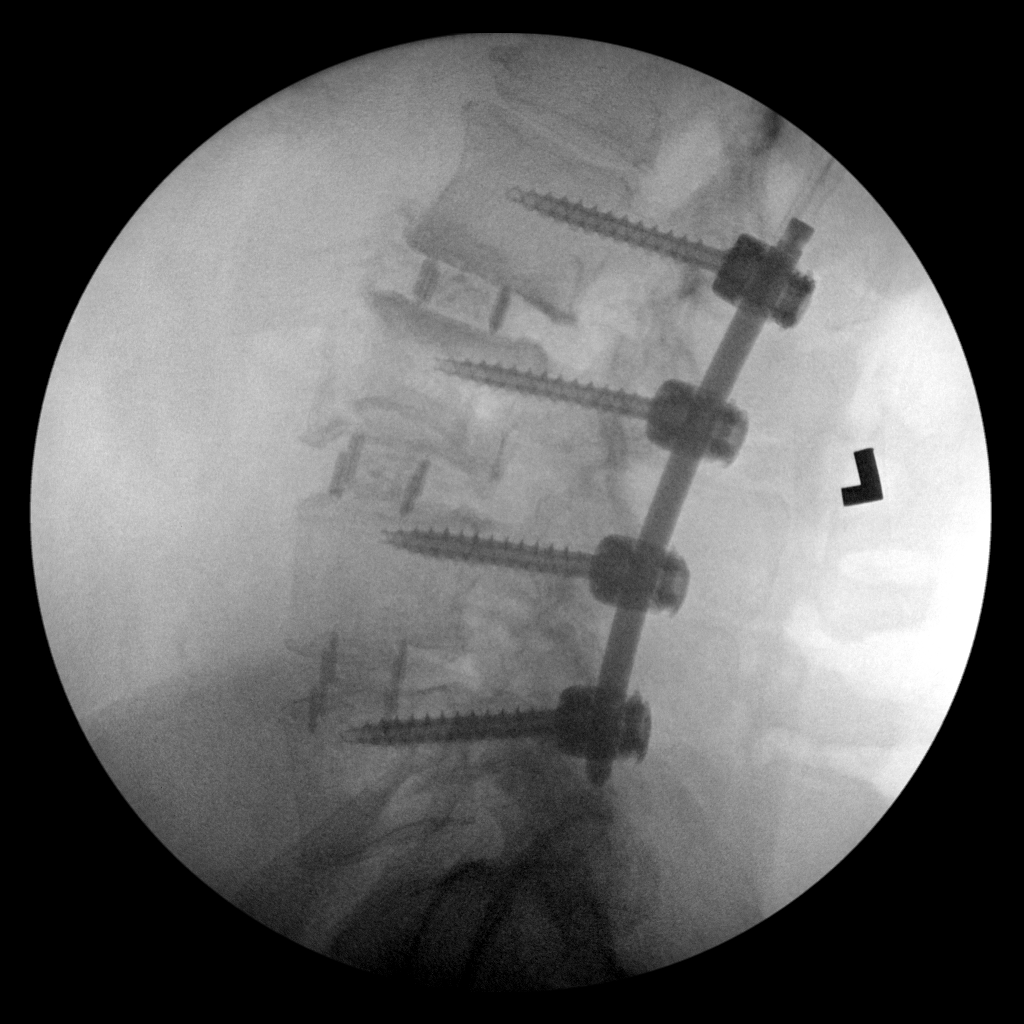
[im 2/2]
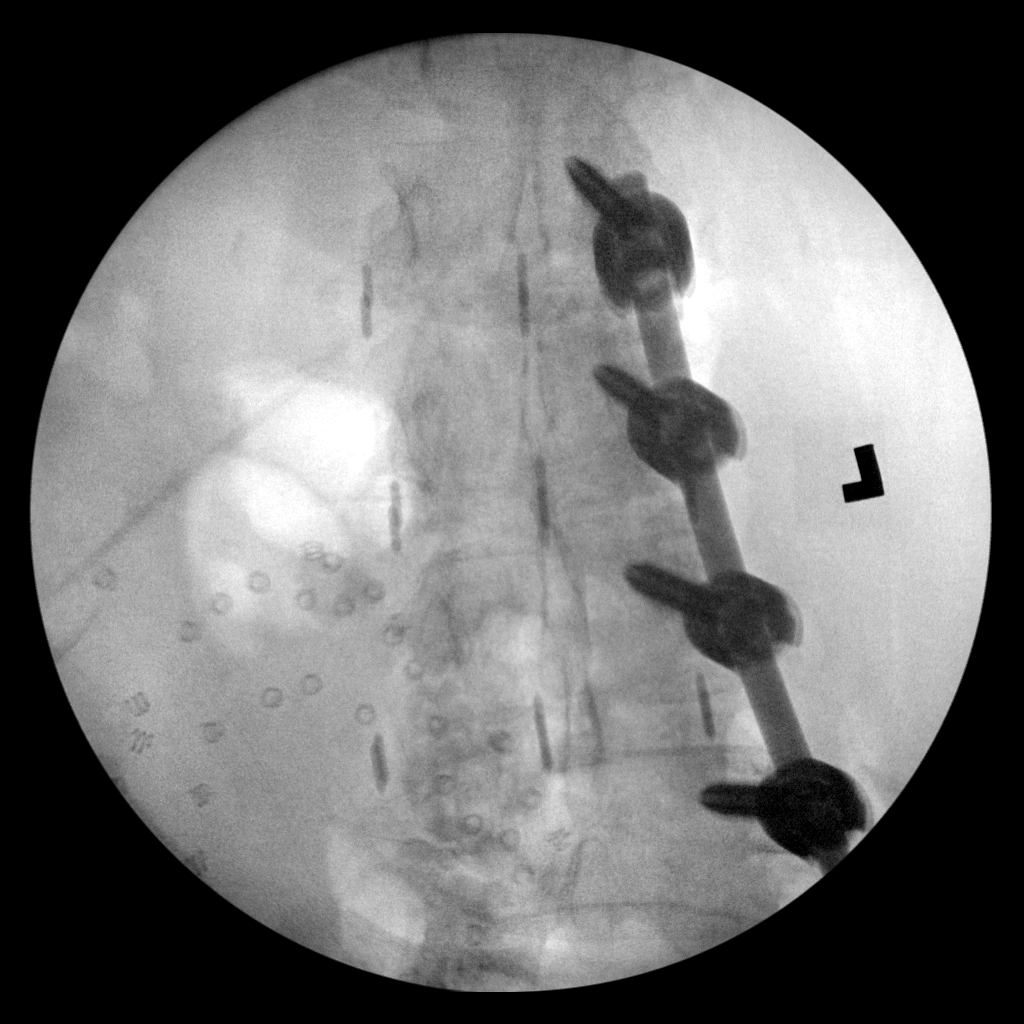

[2 of 2 positions shown; findings below may reference images not displayed]

FLUOROSCOPY TIME:  Radiation Exposure Index (as provided by the
fluoroscopic device): Not available

If the device does not provide the exposure index:

Fluoroscopy Time:  4 minutes 24 seconds

Number of Acquired Images:  2
FINDINGS: Spot films were obtained and reveal pedicle screws on the left at
L2, L3, L4 and L5 with interbody fusion from L2-L5.
IMPRESSION: Intraoperative fusion.

## 2018-05-14 ENCOUNTER — Encounter: Payer: Self-pay | Admitting: Gastroenterology

## 2018-05-14 ENCOUNTER — Ambulatory Visit (INDEPENDENT_AMBULATORY_CARE_PROVIDER_SITE_OTHER): Payer: Medicare Other | Admitting: Gastroenterology

## 2018-05-14 VITALS — BP 132/78 | HR 85 | Ht <= 58 in | Wt 150.1 lb

## 2018-05-14 DIAGNOSIS — R197 Diarrhea, unspecified: Secondary | ICD-10-CM | POA: Diagnosis not present

## 2018-05-14 NOTE — Progress Notes (Signed)
Chief Complaint:   Referring Provider:  Gordan Payment., MD      ASSESSMENT AND PLAN;   #1.  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  Resolved with cholestyramine 4 g p.o. once a day. Plan: - Please obtain previous records -last colonoscopy. - Continue Cholestramine 4g po qd, 2 hours before or after rest of the medications.  She will also continue taking Imodium on as-needed basis as she is doing. - Continue omeprazole to 20 mg p.o. every other day. - FU in 1 yr. Earlier, if with any problems   HPI:    Chelsea Gregory is a 72 y.o. female  For follow-up visit Diarrhea has completely resolved on cholestyramine 4 g p.o. once a day and by reducing omeprazole to every other day. He has not used Imodium in the last 2 weeks Very pleased with the progress The price of cholestyramine has almost doubled. Recently diagnosed with UTI and has been started on cephalexin. With associated some abdominal bloating. No abdominal pain. No nocturnal symptoms Colon 2017 Dr Maryruth Bun - negative Neg stool studies. No weight loss Had problems over the last 15 to 20 years, got worse after colon resection No melena or hematochezia No red flag symptoms Denies having any upper GI symptoms including nausea, vomiting, dysphagia or odynophagia.  She does take omeprazole ever since her gallbladder has been taken out.   Past Medical History:  Diagnosis Date  . Arthritis    Knees  . Chronic kidney disease    kidney infection post colon surgery on Macrodantin  . Diverticular disease of left colon   . GERD (gastroesophageal reflux disease)   . Headache(784.0)    occasionally  migraines  . MRSA colonization 2009  . Neuromuscular disorder (HCC)    Cervical disc  . Seasonal allergies   . Sleep apnea    wear CPAP  tested > 3 years ago    Past Surgical  History:  Procedure Laterality Date  . ABDOMINAL HYSTERECTOMY  2006  . ANTERIOR LAT LUMBAR FUSION Left 03/14/2016   Procedure: Extreme Lumbar Interbody Fusion Left Lumbar Two-Three,Lumbar Three-four,Lumbar Four-Five with Pedicle Screws;  Surgeon: Julio Sicks, MD;  Location: [facility];  Service: Neurosurgery;  Laterality: Left;  left approach  . BACK SURGERY    . CERVICAL DISCECTOMY  2010  . CHOLECYSTECTOMY  2000  . COLON SURGERY  2009   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, aso  had left ovary removed   . Colosotmy  2009  . COLOSTOMY CLOSURE  2010  . EYE SURGERY     Implant to tear duct  . HERNIA REPAIR  12/2009   Right  . HERNIA REPAIR  2012   Left  . JOINT REPLACEMENT Left 2015  . LUMBAR LAMINECTOMY/DECOMPRESSION MICRODISCECTOMY  08/14/2011   Procedure: LUMBAR LAMINECTOMY/DECOMPRESSION MICRODISCECTOMY;  Surgeon: Temple Pacini, MD;  Location: [facility];  Service: Neurosurgery;  Laterality: Left;  Left Lumbar three-four, Bilateral Lumbar four-five decompressive lumbar laminectomy  . LUMBAR PERCUTANEOUS PEDICLE SCREW 3 LEVEL Left 03/14/2016   Procedure: LUMBAR PERCUTANEOUS PEDICLE SCREW 3 LEVEL;  Surgeon: Julio Sicks, MD;  Location: [facility];  Service: Neurosurgery;  Laterality: Left;  . TUBAL LIGATION  1968    Family History  Problem Relation Age of Onset  . Anesthesia problems Neg Hx   . Colon cancer Neg Hx  Social History   Tobacco Use  . Smoking status: Never Smoker  . Smokeless tobacco: Former Engineer, water Use Topics  . Alcohol use: No  . Drug use: No    Current Outpatient Medications  Medication Sig Dispense Refill  . Acetaminophen (TYLENOL ARTHRITIS PAIN PO) Take 1-2 tablets by mouth 2 (two) times daily as needed. For pain.     Marland Kitchen alendronate (FOSAMAX) 70 MG tablet Take 70 mg by mouth every Monday. Take with a full glass of water on an empty stomach.    . cephALEXin (KEFLEX) 500 MG capsule 1500mg  a day for a week and 500mg  daily for month. Started Friday  05/10/2018    . Cholecalciferol (VITAMIN D3) 3000 units TABS Take 3,000 Units by mouth daily.     . cholestyramine (QUESTRAN) 4 g packet Use one packet once daily 2 hours before or after other meds. 30 each 5  . fluticasone (FLONASE) 50 MCG/ACT nasal spray Place 2 sprays into both nostrils.    Marland Kitchen loperamide (IMODIUM A-D) 2 MG tablet Take 2 mg by mouth daily as needed for diarrhea or loose stools.    . meloxicam (MOBIC) 7.5 MG tablet Take 15 mg by mouth as needed (2-3times a week).     . montelukast (SINGULAIR) 10 MG tablet Take 10 mg by mouth at bedtime.     . Multiple Vitamins-Minerals (MULTIVITAMINS THER. W/MINERALS) TABS Take 1 tablet by mouth daily.      Marland Kitchen omeprazole (PRILOSEC) 20 MG capsule Take 20 mg by mouth as needed (3 times a week).     . Probiotic Product (ALIGN PO) Take 1 capsule by mouth daily.       No current facility-administered medications for this visit.     Allergies  Allergen Reactions  . Sulfa Antibiotics Other (See Comments)    Hallucinations  . Celebrex [Celecoxib] Other (See Comments)    Increase in blood pressure.  . Ciprofloxacin Rash  . Levofloxacin Nausea And Vomiting  . Metronidazole Nausea Only  . Promethazine Hcl Other (See Comments)    Right muscle spasm.    Review of Systems:  Negative except for HPI     Physical Exam:    BP 132/78   Pulse 85   Ht 4\' 10"  (1.473 m)   Wt 150 lb 2 oz (68.1 kg)   BMI 31.38 kg/m  Filed Weights   05/14/18 0917  Weight: 150 lb 2 oz (68.1 kg)   Constitutional:  Well-developed, in no acute distress. Psychiatric: Normal mood and affect. Behavior is normal. HEENT: Pupils normal.  Conjunctivae are normal. No scleral icterus. Neck supple.  Cardiovascular: Normal rate, regular rhythm. No edema Pulmonary/chest: Effort normal and breath sounds normal. No wheezing, rales or rhonchi. Abdominal: Soft, nondistended. Nontender. Bowel sounds active throughout. There are no masses palpable. No hepatomegaly.  Multiple  well-healed scars. Rectal:  defered Neurological: Alert and oriented to person place and time. Skin: Skin is warm and dry. No rashes noted.  Data Reviewed: I have personally reviewed following labs and imaging studies  CBC: CBC Latest Ref Rng & Units 03/07/2016 08/08/2011 04/14/2009  WBC 4.0 - 10.5 K/uL 7.8 8.4 8.5  Hemoglobin 12.0 - 15.0 g/dL 16.1 11.7(L) 11.6(L)  Hematocrit 36.0 - 46.0 % 40.1 37.1 35.0(L)  Platelets 150 - 400 K/uL 274 286 294    CMP: CMP Latest Ref Rng & Units 03/07/2016  Glucose 65 - 99 mg/dL 096(E)  BUN 6 - 20 mg/dL 13  Creatinine 4.54 - 0.98 mg/dL 1.19  Sodium 135 - 145 mmol/L 139  Potassium 3.5 - 5.1 mmol/L 3.9  Chloride 101 - 111 mmol/L 107  CO2 22 - 32 mmol/L 24  Calcium 8.9 - 10.3 mg/dL 9.6   Seen in presence of patient's husband. I spent 15 minutes of face-to-face time with the patient. Greater than 50% of the time was spent counseling and coordinating care.   Edman Circle, MD 05/14/2018, 9:36 AM  Cc: Gordan Payment., MD

## 2018-05-14 NOTE — Patient Instructions (Signed)
If you are age 74 or older, your body mass index should be between 23-30. Your Body mass index is 31.38 kg/m. If this is out of the aforementioned range listed, please consider follow up with your Primary Care Provider.  If you are age 34 or younger, your body mass index should be between 19-25. Your Body mass index is 31.38 kg/m. If this is out of the aformentioned range listed, please consider follow up with your Primary Care Provider.    Thank you,  Dr. Lynann Bologna

## 2018-07-26 ENCOUNTER — Telehealth: Payer: Self-pay | Admitting: Gastroenterology

## 2018-07-26 MED ORDER — CHOLESTYRAMINE 4 G PO PACK: PACK | ORAL | 5 refills | Status: DC

## 2018-07-26 NOTE — Telephone Encounter (Signed)
Pt last seen 05/14/18 Dr. Chales Abrahams.  Pt requests refill of cholestyramine packets to be sent to [facility].

## 2018-07-26 NOTE — Telephone Encounter (Signed)
Sent refill to patients pharmacy. 

## 2018-09-26 ENCOUNTER — Other Ambulatory Visit: Payer: Self-pay | Admitting: Gastroenterology

## 2019-10-28 ENCOUNTER — Other Ambulatory Visit: Payer: Self-pay | Admitting: Gastroenterology

## 2019-10-29 ENCOUNTER — Other Ambulatory Visit: Payer: Self-pay | Admitting: Gastroenterology

## 2019-11-20 ENCOUNTER — Ambulatory Visit (INDEPENDENT_AMBULATORY_CARE_PROVIDER_SITE_OTHER): Payer: Medicare Other | Admitting: Gastroenterology

## 2019-11-20 ENCOUNTER — Encounter: Payer: Self-pay | Admitting: Gastroenterology

## 2019-11-20 ENCOUNTER — Other Ambulatory Visit: Payer: Self-pay

## 2019-11-20 VITALS — BP 134/80 | HR 75 | Temp 97.5°F | Ht <= 58 in | Wt 144.0 lb

## 2019-11-20 DIAGNOSIS — R197 Diarrhea, unspecified: Secondary | ICD-10-CM

## 2019-11-20 MED ORDER — CHOLESTYRAMINE 4 G PO PACK: PACK | ORAL | 4 refills | Status: DC

## 2019-11-20 NOTE — Patient Instructions (Signed)
If you are age 76 or older, your body mass index should be between 23-30. Your Body mass index is 30.1 kg/m. If this is out of the aforementioned range listed, please consider follow up with your Primary Care Provider.  If you are age 76 or younger, your body mass index should be between 19-25. Your Body mass index is 30.1 kg/m. If this is out of the aformentioned range listed, please consider follow up with your Primary Care Provider.   We have sent the following medications to your pharmacy for you to pick up at your convenience: Cholestyramine  Follow up in 1 year.   Thank you,  Dr. Lynann Bologna

## 2019-11-20 NOTE — Progress Notes (Signed)
Chief Complaint:   Referring Provider:  Gordan Payment., MD      ASSESSMENT AND PLAN;   #1.  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  Resolved with cholestyramine 4 g p.o. once a day. Plan: - Please obtain previous records -last colonoscopy. - Continue Cholestramine 4g po qd, #90, 4 refills, 2 hours before or after rest of the medications.  She will also continue taking Imodium on as-needed basis as she is doing. - FU in 1 yr. Earlier, if with any problems   HPI:    Chelsea Gregory is a 74 y.o. female  For follow-up visit No complaints. Diarrhea has completely resolved on cholestyramine 4 g p.o. once a day and by taking Imodium on as needed basis.  Had COVID-19 second shot.  Still having fatigue.  She does feel much better.  No other GI symptoms.  Wt Readings from Last 3 Encounters:  11/20/19 144 lb (65.3 kg)  05/14/18 150 lb 2 oz (68.1 kg)  02/08/18 149 lb (67.6 kg)    Husband - cataract Sx Past Medical History:  Diagnosis Date  . Arthritis    Knees  . Chronic kidney disease    kidney infection post colon surgery on Macrodantin  . Diverticular disease of left colon   . GERD (gastroesophageal reflux disease)   . Headache(784.0)    occasionally  migraines  . MRSA colonization 2009  . Neuromuscular disorder (HCC)    Cervical disc  . Seasonal allergies   . Sleep apnea    wear CPAP  tested > 3 years ago    Past Surgical History:  Procedure Laterality Date  . ABDOMINAL HYSTERECTOMY  2006  . ANTERIOR LAT LUMBAR FUSION Left 03/14/2016   Procedure: Extreme Lumbar Interbody Fusion Left Lumbar Two-Three,Lumbar Three-four,Lumbar Four-Five with Pedicle Screws;  Surgeon: Julio Sicks, MD;  Location: [facility];  Service: Neurosurgery;  Laterality: Left;  left approach  . BACK SURGERY    . CERVICAL DISCECTOMY  2010  . CHOLECYSTECTOMY  2000  . COLON SURGERY   2009   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, aso  had left ovary removed   . Colosotmy  2009  . COLOSTOMY CLOSURE  2010  . EYE SURGERY     Implant to tear duct  . HERNIA REPAIR  12/2009   Right  . HERNIA REPAIR  2012   Left  . JOINT REPLACEMENT Left 2015  . LUMBAR LAMINECTOMY/DECOMPRESSION MICRODISCECTOMY  08/14/2011   Procedure: LUMBAR LAMINECTOMY/DECOMPRESSION MICRODISCECTOMY;  Surgeon: Temple Pacini, MD;  Location: [facility];  Service: Neurosurgery;  Laterality: Left;  Left Lumbar three-four, Bilateral Lumbar four-five decompressive lumbar laminectomy  . LUMBAR PERCUTANEOUS PEDICLE SCREW 3 LEVEL Left 03/14/2016   Procedure: LUMBAR PERCUTANEOUS PEDICLE SCREW 3 LEVEL;  Surgeon: Julio Sicks, MD;  Location: [facility];  Service: Neurosurgery;  Laterality: Left;  . TUBAL LIGATION  1968    Family History  Problem Relation Age of Onset  . Anesthesia problems Neg Hx   . Colon cancer Neg Hx     Social History   Tobacco Use  . Smoking status: Never Smoker  . Smokeless tobacco: Never Used  Substance Use Topics  . Alcohol use: No  . Drug use: No    Current Outpatient Medications  Medication Sig Dispense Refill  . acetaminophen (TYLENOL 8 HOUR ARTHRITIS PAIN) 650 MG CR tablet Take 2,600 mg  by mouth daily.    Marland Kitchen alendronate (FOSAMAX) 70 MG tablet Take 70 mg by mouth every Monday. Take with a full glass of water on an empty stomach.    . ALPRAZolam (XANAX) 0.5 MG tablet Take 0.5-1 mg by mouth at bedtime as needed for anxiety.    . AMBULATORY NON FORMULARY MEDICATION every 30 (thirty) days. Allergy Shot    . AMBULATORY NON FORMULARY MEDICATION 3 tablets daily. Mannose    . bismuth subsalicylate (PEPTO BISMOL) 262 MG chewable tablet Chew 524 mg by mouth as needed.    . butalbital-acetaminophen-caffeine (FIORICET) 50-325-40 MG tablet Take 1 tablet by mouth as needed for headache.    . Cholecalciferol (VITAMIN D3) 3000 units TABS Take 3,000 Units by mouth daily.     . cholestyramine  (QUESTRAN) 4 g packet MIX 1 PACKET WITH LIQUID  AND DRINK ONCE DAILY 2  HOURS BEFORE OR AFTER OTHER MEDS 120 each 11  . cycloSPORINE (RESTASIS) 0.05 % ophthalmic emulsion Place 1 drop into both eyes 2 (two) times daily.    . fexofenadine (ALLEGRA) 180 MG tablet Take 180 mg by mouth daily.    . fluticasone (FLONASE) 50 MCG/ACT nasal spray Place 2 sprays into both nostrils.    Marland Kitchen loperamide (IMODIUM A-D) 2 MG tablet Take 1-3 tablets by mouth as directed.     . meloxicam (MOBIC) 7.5 MG tablet Take 7.5-15 mg by mouth daily.     . Misc Natural Products (OSTEO BI-FLEX TRIPLE STRENGTH PO) Take 1 tablet by mouth daily.    . montelukast (SINGULAIR) 10 MG tablet Take 10 mg by mouth daily.    . Multiple Vitamins-Minerals (MULTIVITAMINS THER. W/MINERALS) TABS Take 1 tablet by mouth daily.      Marland Kitchen trimethoprim (TRIMPEX) 100 MG tablet Take 100 mg by mouth daily.    Marland Kitchen omeprazole (PRILOSEC) 20 MG capsule Take 20 mg by mouth as needed.     No current facility-administered medications for this visit.    Allergies  Allergen Reactions  . Sulfa Antibiotics Other (See Comments)    Hallucinations  . Sulfur     Hallucinations   . Celebrex [Celecoxib] Other (See Comments)    Increase in blood pressure.  . Ciprofloxacin Rash  . Levofloxacin Nausea And Vomiting  . Metronidazole Nausea Only    Severe rash  . Promethazine Hcl Other (See Comments)    Right muscle spasm.    Review of Systems:  Negative except for HPI     Physical Exam:    BP 134/80   Pulse 75   Temp (!) 97.5 F (36.4 C)   Ht 4\' 10"  (1.473 m)   Wt 144 lb (65.3 kg)   BMI 30.10 kg/m  Filed Weights   11/20/19 1031  Weight: 144 lb (65.3 kg)   Constitutional:  Well-developed, in no acute distress. Psychiatric: Normal mood and affect. Behavior is normal. HEENT: Pupils normal.  Conjunctivae are normal. No scleral icterus. Neck supple.  Cardiovascular: Normal rate, regular rhythm. No edema Pulmonary/chest: Effort normal and breath  sounds normal. No wheezing, rales or rhonchi. Abdominal: Soft, nondistended. Nontender. Bowel sounds active throughout. There are no masses palpable. No hepatomegaly.  Multiple well-healed scars. Rectal:  defered Neurological: Alert and oriented to person place and time. Skin: Skin is warm and dry. No rashes noted.  Data Reviewed: I have personally reviewed following labs and imaging studies  CBC: CBC Latest Ref Rng & Units 03/07/2016 08/08/2011 04/14/2009  WBC 4.0 - 10.5 K/uL 7.8 8.4 8.5  Hemoglobin 12.0 - 15.0 g/dL 12.7 11.7(L) 11.6(L)  Hematocrit 36.0 - 46.0 % 40.1 37.1 35.0(L)  Platelets 150 - 400 K/uL 274 286 294    CMP: CMP Latest Ref Rng & Units 03/07/2016  Glucose 65 - 99 mg/dL 109(H)  BUN 6 - 20 mg/dL 13  Creatinine 0.44 - 1.00 mg/dL 0.85  Sodium 135 - 145 mmol/L 139  Potassium 3.5 - 5.1 mmol/L 3.9  Chloride 101 - 111 mmol/L 107  CO2 22 - 32 mmol/L 24  Calcium 8.9 - 10.3 mg/dL 9.6   I spent 15 minutes of face-to-face time with the patient. Greater than 50% of the time was spent counseling and coordinating care.   Carmell Austria, MD 11/20/2019, 11:00 AM  Cc: Raina Mina., MD

## 2020-09-07 ENCOUNTER — Other Ambulatory Visit: Payer: Self-pay | Admitting: Gastroenterology

## 2021-12-02 ENCOUNTER — Other Ambulatory Visit: Payer: Self-pay | Admitting: Gastroenterology

## 2022-01-09 ENCOUNTER — Telehealth: Payer: Self-pay | Admitting: Gastroenterology

## 2022-01-09 MED ORDER — CHOLESTYRAMINE 4 G PO PACK
PACK | ORAL | 0 refills | Status: DC
Start: 2022-01-09 — End: 2022-01-09

## 2022-01-09 MED ORDER — CHOLESTYRAMINE 4 G PO PACK: 4.0000 g | PACK | Freq: Every day | ORAL | 0 refills | Status: DC

## 2022-01-09 NOTE — Telephone Encounter (Signed)
Spoke to patient. Told her I would send a script in but she needed to make an appointment. Scheduled 7-18 and she has the [place] address and was told to check in on the second floor and come 15 minutes early.   Medication sent to mail order

## 2022-01-12 MED ORDER — CHOLESTYRAMINE 4 G PO PACK: 4.0000 g | PACK | Freq: Every day | ORAL | 0 refills | Status: DC

## 2022-01-12 NOTE — Telephone Encounter (Signed)
Patient called stating that [facility] could not fill cholestyramine. Patient is requesting it to be resent to [facility] in [place]. Please advise.

## 2022-01-12 NOTE — Addendum Note (Signed)
Addended by: Alberteen Sam E on: 01/12/2022 10:14 AM   Modules accepted: Orders

## 2022-02-07 ENCOUNTER — Encounter: Payer: Self-pay | Admitting: Gastroenterology

## 2022-02-07 ENCOUNTER — Ambulatory Visit (INDEPENDENT_AMBULATORY_CARE_PROVIDER_SITE_OTHER): Payer: Medicare Other | Admitting: Gastroenterology

## 2022-02-07 VITALS — BP 140/84 | HR 99 | Ht <= 58 in | Wt 151.0 lb

## 2022-02-07 DIAGNOSIS — R197 Diarrhea, unspecified: Secondary | ICD-10-CM

## 2022-02-07 MED ORDER — CHOLESTYRAMINE 4 G PO PACK: 4.0000 g | PACK | Freq: Every day | ORAL | 4 refills | Status: DC

## 2022-02-07 NOTE — Progress Notes (Signed)
Chief Complaint:   Referring Provider:  Gordan Payment., MD      ASSESSMENT AND PLAN;   #1.  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  Resolved with cholestyramine 4 g p.o. once a day. Plan:  - Continue Cholestramine 4g po qd, #90, 4 refills, 2 hours before or after rest of the medications.  She will also continue taking Imodium on as-needed basis as she is doing.  - FU in 1 yr. Earlier, if with any problems   HPI:    Chelsea Gregory is a 76 y.o. female  For follow-up visit No complaints. Diarrhea has completely resolved on cholestyramine 4 g p.o. once a day and by taking Imodium on as needed basis.  She does feel much better.  No other GI symptoms.  Wt Readings from Last 3 Encounters:  02/07/22 151 lb (68.5 kg)  11/20/19 144 lb (65.3 kg)  05/14/18 150 lb 2 oz (68.1 kg)    Husband - cataract Sx Past Medical History:  Diagnosis Date   Arthritis    Knees   Chronic kidney disease    kidney infection post colon surgery on Macrodantin   Diverticular disease of left colon    GERD (gastroesophageal reflux disease)    Headache(784.0)    occasionally  migraines   MRSA colonization 2009   Neuromuscular disorder (HCC)    Cervical disc   Seasonal allergies    Sleep apnea    wear CPAP  tested > 3 years ago    Past Surgical History:  Procedure Laterality Date   ABDOMINAL HYSTERECTOMY  2006   ANTERIOR LAT LUMBAR FUSION Left 03/14/2016   Procedure: Extreme Lumbar Interbody Fusion Left Lumbar Two-Three,Lumbar Three-four,Lumbar Four-Five with Pedicle Screws;  Surgeon: Julio Sicks, MD;  Location: [facility];  Service: Neurosurgery;  Laterality: Left;  left approach   BACK SURGERY     CERVICAL DISCECTOMY  2010   CHOLECYSTECTOMY  2000   COLON SURGERY  2009   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, aso  had left ovary removed    Colosotmy  2009   COLOSTOMY CLOSURE   2010   EYE SURGERY     Implant to tear duct   HERNIA REPAIR  12/2009   Right   HERNIA REPAIR  2012   Left   JOINT REPLACEMENT Left 2015   LUMBAR LAMINECTOMY/DECOMPRESSION MICRODISCECTOMY  08/14/2011   Procedure: LUMBAR LAMINECTOMY/DECOMPRESSION MICRODISCECTOMY;  Surgeon: Temple Pacini, MD;  Location: [facility];  Service: Neurosurgery;  Laterality: Left;  Left Lumbar three-four, Bilateral Lumbar four-five decompressive lumbar laminectomy   LUMBAR PERCUTANEOUS PEDICLE SCREW 3 LEVEL Left 03/14/2016   Procedure: LUMBAR PERCUTANEOUS PEDICLE SCREW 3 LEVEL;  Surgeon: Julio Sicks, MD;  Location: [facility];  Service: Neurosurgery;  Laterality: Left;   TUBAL LIGATION  1968    Family History  Problem Relation Age of Onset   Anesthesia problems Neg Hx    Colon cancer Neg Hx     Social History   Tobacco Use   Smoking status: Never   Smokeless tobacco: Never  Vaping Use   Vaping Use: Never used  Substance Use Topics   Alcohol use: No   Drug use: No    Current Outpatient Medications  Medication Sig Dispense Refill   acetaminophen (TYLENOL 8 HOUR ARTHRITIS PAIN) 650 MG CR tablet Take 2,600 mg by mouth daily.  alendronate (FOSAMAX) 70 MG tablet Take 70 mg by mouth every Monday. Take with a full glass of water on an empty stomach.     ALPRAZolam (XANAX) 0.5 MG tablet Take 0.5-1 mg by mouth at bedtime as needed for anxiety.     AMBULATORY NON FORMULARY MEDICATION every 30 (thirty) days. Allergy Shot     AMBULATORY NON FORMULARY MEDICATION 3 tablets daily. Mannose     bismuth subsalicylate (PEPTO BISMOL) 262 MG chewable tablet Chew 524 mg by mouth as needed.     butalbital-acetaminophen-caffeine (FIORICET) 50-325-40 MG tablet Take 1 tablet by mouth as needed for headache.     cephALEXin (KEFLEX) 250 MG capsule Take 250 mg by mouth daily.     Cholecalciferol (VITAMIN D3) 3000 units TABS Take 3,000 Units by mouth daily.      cholestyramine (QUESTRAN) 4 g packet Take 1 packet (4 g total) by  mouth daily in the afternoon. Take 2 hours before or after all other medications Call [phone] for more refills 90 each 0   cycloSPORINE (RESTASIS) 0.05 % ophthalmic emulsion Place 1 drop into both eyes 2 (two) times daily.     fexofenadine (ALLEGRA) 180 MG tablet Take 180 mg by mouth daily.     fluticasone (FLONASE) 50 MCG/ACT nasal spray Place 2 sprays into both nostrils.     loperamide (IMODIUM A-D) 2 MG tablet Take 1-3 tablets by mouth as directed.     meloxicam (MOBIC) 7.5 MG tablet Take 7.5-15 mg by mouth daily.     Misc Natural Products (OSTEO BI-FLEX TRIPLE STRENGTH PO) Take 1 tablet by mouth daily.     montelukast (SINGULAIR) 10 MG tablet Take 10 mg by mouth daily.     Multiple Vitamins-Minerals (MULTIVITAMINS THER. W/MINERALS) TABS Take 1 tablet by mouth daily.       No current facility-administered medications for this visit.    Allergies  Allergen Reactions   Sulfa Antibiotics Other (See Comments)    Hallucinations   Elemental Sulfur     Hallucinations    Celebrex [Celecoxib] Other (See Comments)    Increase in blood pressure.   Ciprofloxacin Rash   Levofloxacin Nausea And Vomiting   Metronidazole Nausea Only    Severe rash   Promethazine Hcl Other (See Comments)    Right muscle spasm.    Review of Systems:  Negative except for HPI     Physical Exam:    BP 140/84   Pulse 99   Ht 4\' 10"  (1.473 m)   Wt 151 lb (68.5 kg)   BMI 31.56 kg/m  Filed Weights   02/07/22 1115  Weight: 151 lb (68.5 kg)   Constitutional:  Well-developed, in no acute distress. Psychiatric: Normal mood and affect. Behavior is normal. HEENT: Pupils normal.  Conjunctivae are normal. No scleral icterus. Neck supple.  Cardiovascular: Normal rate, regular rhythm. No edema Pulmonary/chest: Effort normal and breath sounds normal. No wheezing, rales or rhonchi. Abdominal: Soft, nondistended. Nontender. Bowel sounds active throughout. There are no masses palpable. No hepatomegaly.  Multiple  well-healed scars. Rectal:  defered Neurological: Alert and oriented to person place and time. Skin: Skin is warm and dry. No rashes noted.  Data Reviewed: I have personally reviewed following labs and imaging studies  CBC:    Latest Ref Rng & Units 03/07/2016    4:15 PM 08/08/2011   12:26 PM 04/14/2009   12:55 PM  CBC  WBC 4.0 - 10.5 K/uL 7.8  8.4  8.5   Hemoglobin 12.0 - 15.0  g/dL 54.6  50.3  54.6   Hematocrit 36.0 - 46.0 % 40.1  37.1  35.0   Platelets 150 - 400 K/uL 274  286  294     CMP:    Latest Ref Rng & Units 03/07/2016    4:15 PM  CMP  Glucose 65 - 99 mg/dL 568   BUN 6 - 20 mg/dL 13   Creatinine 1.27 - 1.00 mg/dL 5.17   Sodium 001 - 749 mmol/L 139   Potassium 3.5 - 5.1 mmol/L 3.9   Chloride 101 - 111 mmol/L 107   CO2 22 - 32 mmol/L 24   Calcium 8.9 - 10.3 mg/dL 9.6    I spent 15 minutes of face-to-face time with the patient. Greater than 50% of the time was spent counseling and coordinating care.   Edman Circle, MD 02/07/2022, 11:44 AM  Cc: Gordan Payment., MD

## 2022-02-07 NOTE — Patient Instructions (Signed)
If you are age 78 or older, your body mass index should be between 23-30. Your Body mass index is 31.56 kg/m. If this is out of the aforementioned range listed, please consider follow up with your Primary Care Provider.  If you are age 57 or younger, your body mass index should be between 19-25. Your Body mass index is 31.56 kg/m. If this is out of the aformentioned range listed, please consider follow up with your Primary Care Provider.   ________________________________________________________  The [facility] providers would like to encourage you to use [facility] to communicate with providers for non-urgent requests or questions.  Due to long hold times on the telephone, sending your provider a message by [facility] may be a faster and more efficient way to get a response.  Please allow 48 business hours for a response.  Please remember that this is for non-urgent requests.  _______________________________________________________  We have sent the following medications to your pharmacy for you to pick up at your convenience: Questran  Continue Imodium as needed  Schedule an office visit in 1 year.  Thank you,  Dr. Lynann Bologna

## 2022-07-28 ENCOUNTER — Telehealth: Payer: Self-pay | Admitting: Gastroenterology

## 2022-07-28 NOTE — Telephone Encounter (Signed)
PT is calling to get refill on chloestyramine. She usually gets it through a mail order but they have none in stock. She would like to have refill sent to [facility] in [place] instead.Please advise.

## 2022-07-31 MED ORDER — CHOLESTYRAMINE 4 G PO PACK: 4.0000 g | PACK | Freq: Every day | ORAL | 4 refills | Status: DC

## 2022-07-31 NOTE — Telephone Encounter (Signed)
Done to local pharmacy 

## 2023-02-23 ENCOUNTER — Other Ambulatory Visit: Payer: Self-pay | Admitting: Gastroenterology

## 2023-03-02 ENCOUNTER — Other Ambulatory Visit: Payer: Self-pay

## 2023-03-02 ENCOUNTER — Ambulatory Visit: Payer: Medicare Other

## 2023-03-02 VITALS — BP 128/80 | HR 73 | Ht <= 58 in | Wt 146.4 lb

## 2023-03-02 DIAGNOSIS — R42 Dizziness and giddiness: Secondary | ICD-10-CM | POA: Insufficient documentation

## 2023-03-02 DIAGNOSIS — T733XXA Exhaustion due to excessive exertion, initial encounter: Secondary | ICD-10-CM | POA: Insufficient documentation

## 2023-03-02 DIAGNOSIS — R5383 Other fatigue: Secondary | ICD-10-CM

## 2023-03-02 DIAGNOSIS — R0602 Shortness of breath: Secondary | ICD-10-CM | POA: Diagnosis not present

## 2023-03-02 DIAGNOSIS — R0609 Other forms of dyspnea: Secondary | ICD-10-CM | POA: Insufficient documentation

## 2023-03-02 HISTORY — DX: Other forms of dyspnea: R06.09

## 2023-03-02 HISTORY — DX: Other fatigue: R53.83

## 2023-03-02 NOTE — Patient Instructions (Signed)
Medication Instructions:  Your physician recommends that you continue on your current medications as directed. Please refer to the Current Medication list given to you today.  *If you need a refill on your cardiac medications before your next appointment, please call your pharmacy*   Lab Work: None If you have labs (blood work) drawn today and your tests are completely normal, you will receive your results only by: MyChart Message (if you have MyChart) OR A paper copy in the mail If you have any lab test that is abnormal or we need to change your treatment, we will call you to review the results.   Testing/Procedures: Your physician has requested that you have an echocardiogram. Echocardiography is a painless test that uses sound waves to create images of your heart. It provides your doctor with information about the size and shape of your heart and how well your heart's chambers and valves are working. This procedure takes approximately one hour. There are no restrictions for this procedure. Please do NOT wear cologne, perfume, aftershave, or lotions (deodorant is allowed). Please arrive 15 minutes prior to your appointment time.    [facility] [address] [place], [place] [address] Phone:  [phone]    Please arrive 15 minutes prior to your appointment time for registration and insurance purposes.  The test will take approximately 3 to 4 hours to complete; you may bring reading material.  If someone comes with you to your appointment, they will need to remain in the main lobby due to limited space in the testing area. **If you are pregnant or breastfeeding, please notify the nuclear lab prior to your appointment**  How to prepare for your Myocardial Perfusion Test: Do not eat or drink 3 hours prior to your test, except you may have water. Do not consume products containing caffeine (regular or decaffeinated) 12 hours prior to your test. (ex: coffee,  chocolate, sodas, tea). Do bring a list of your current medications with you.  If not listed below, you may take your medications as normal. Do wear comfortable clothes (no dresses or overalls) and walking shoes, tennis shoes preferred (No heels or open toe shoes are allowed). Do NOT wear cologne, perfume, aftershave, or lotions (deodorant is allowed). If these instructions are not followed, your test will have to be rescheduled.  Please report to [address] for your test.  If you have questions or concerns about your appointment, you can call the [facility] at [phone].  If you cannot keep your appointment, please provide 24 hours notification to the Nuclear Lab, to avoid a possible $50 charge to your account.    Follow-Up: At [facility], you and your health needs are our priority.  As part of our continuing mission to provide you with exceptional heart care, we have created designated Provider Care Teams.  These Care Teams include your primary Cardiologist (physician) and Advanced Practice Providers (APPs -  Physician Assistants and Nurse Practitioners) who all work together to provide you with the care you need, when you need it.  We recommend signing up for the patient portal called "MyChart".  Sign up information is provided on this After Visit Summary.  MyChart is used to connect with patients for Virtual Visits (Telemedicine).  Patients are able to view lab/test results, encounter notes, upcoming appointments, etc.  Non-urgent messages can be sent to your provider as well.   To learn more about what you can do with  MyChart, go to [email or web address].    Your next appointment:   Follow up to be determined based on test  Provider:   Dr. Vincent Gros   Other Instructions None

## 2023-03-02 NOTE — Assessment & Plan Note (Addendum)
Correlates with her exertional symptoms as above.  Workup to rule out cardiac causes as above.  Her dizziness appears to be longstanding. However she does have recent fall history and imaging results of CT head are not available to me currently. Advised to reach out to PCP to go over these test results.

## 2023-03-02 NOTE — Assessment & Plan Note (Signed)
Mild intensity symptoms with exertion over the past month and a half.  Onset temporally correlated with mechanical fall a month ago.   Proceed with further evalution with transthoracic echocardiogram and treadmill stress test with nuclear imaging.  Will review results once available. Advised to monitor symptoms and if progressive or persisting longer duration or with minimal effort to notify us immediately.  Follow up in clinic based on test results.

## 2023-03-02 NOTE — Progress Notes (Signed)
Cardiology Consultation:    Date:  03/02/2023   ID:  Lashanti, Skarzynski Dec 14, 1945, MRN [id number]  PCP:  Krystal Clark, NP  Cardiologist:  Marlyn Corporal , MD   Referring MD: Rhea Bleacher*   Dyspnea on exertion, fatigue and off balance  History of Present Illness:    Chelsea Gregory is a 77 y.o. female who is being seen today for the evaluation of dyspnea on exertion progressive, fatigue and feeling off balance at the request of Rhea Bleacher*.   Very pleasant woman here for the visit accompanied by her husband.  Denies any significant prior cardiac history.  Does have history of diverticulosis, obstructive sleep apnea uses CPAP, back pain and neck pain, GERD.  Recently while at home helping her husband, while driving the golf cart she sustained a fall stepping out out of the vehicle as her foot got stuck.  Did not lose consciousness, sustained a bruise on her left side of her forehead and left side of the upper chest.  No further recurrent symptoms.  Denies any preceding cardiac symptoms.  Ever since then she has had noticed sensation of being off balance.  Also has mild sensation of headache at times.  For this reason she has not underwent recent imaging studies with CT scan of the head on August 6 at [facility] results of this are not available to me and she has not been informed of these results yet.  She also describes symptoms of shortness of breath ever since she sustained a fall.  Describes this as a sensation of feeling out of breath as she walks to her mailbox or if she walks up a flight of stairs.  This is associated with a sense of fatigue.  Relieved with rest.  She is able to continue doing her day-to-day activities.  Denies any orthopnea paroxysmal nocturnal dyspnea. other than once in a mild rare sensation of fluttering in the chest that lasts for seconds, denies any significant palpitations.  Denies any syncopal episodes.   Denies any pedal edema. She does have history of allergies, typically seasonal in August and feels her symptoms of shortness of breath may have been related to that.  She does have mild sensation of tingling going down her extremities, describes this as a chronic sensation related to her back pain.  No focal neurological symptoms.  No significant vision change.  She does have recurrent UTI infections and is in the past 10 days was on Macrobid.  Does not smoke or drink alcohol.  No recreational drug use.   Past Medical History:  Diagnosis Date   Arthritis    Knees   Chronic kidney disease    kidney infection post colon surgery on Macrodantin   Diverticular disease of left colon    GERD (gastroesophageal reflux disease)    Headache(784.0)    occasionally  migraines   MRSA colonization 2009   Neuromuscular disorder (HCC)    Cervical disc   Seasonal allergies    Sleep apnea    wear CPAP  tested > 3 years ago    Past Surgical History:  Procedure Laterality Date   ABDOMINAL HYSTERECTOMY  2006   ANTERIOR LAT LUMBAR FUSION Left 03/14/2016   Procedure: Extreme Lumbar Interbody Fusion Left Lumbar Two-Three,Lumbar Three-four,Lumbar Four-Five with Pedicle Screws;  Surgeon: Julio Sicks, MD;  Location: [facility];  Service: Neurosurgery;  Laterality: Left;  left approach   BACK SURGERY     CERVICAL DISCECTOMY  2010  CHOLECYSTECTOMY  2000   COLON SURGERY  2009   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, aso  had left ovary removed    Colosotmy  2009   COLOSTOMY CLOSURE  2010   EYE SURGERY     Implant to tear duct   HERNIA REPAIR  12/2009   Right   HERNIA REPAIR  2012   Left   JOINT REPLACEMENT Left 2015   LUMBAR LAMINECTOMY/DECOMPRESSION MICRODISCECTOMY  08/14/2011   Procedure: LUMBAR LAMINECTOMY/DECOMPRESSION MICRODISCECTOMY;  Surgeon: Temple Pacini, MD;  Location: [facility];  Service: Neurosurgery;  Laterality: Left;  Left Lumbar three-four, Bilateral Lumbar four-five decompressive  lumbar laminectomy   LUMBAR PERCUTANEOUS PEDICLE SCREW 3 LEVEL Left 03/14/2016   Procedure: LUMBAR PERCUTANEOUS PEDICLE SCREW 3 LEVEL;  Surgeon: Julio Sicks, MD;  Location: [facility];  Service: Neurosurgery;  Laterality: Left;   TUBAL LIGATION  1968    Current Medications: Current Meds  Medication Sig   acetaminophen (TYLENOL 8 HOUR ARTHRITIS PAIN) 650 MG CR tablet Take 2,600 mg by mouth daily.   alendronate (FOSAMAX) 70 MG tablet Take 70 mg by mouth every Monday. Take with a full glass of water on an empty stomach.   ALPRAZolam (XANAX) 0.5 MG tablet Take 0.5-1 mg by mouth at bedtime as needed for anxiety.   AMBULATORY NON FORMULARY MEDICATION every 30 (thirty) days. Allergy Shot   AMBULATORY NON FORMULARY MEDICATION 3 tablets daily. Mannose   bismuth subsalicylate (PEPTO BISMOL) 262 MG chewable tablet Chew 524 mg by mouth as needed.   butalbital-acetaminophen-caffeine (FIORICET) 50-325-40 MG tablet Take 1 tablet by mouth as needed for headache.   cephALEXin (KEFLEX) 250 MG capsule Take 250 mg by mouth daily.   Cholecalciferol (VITAMIN D3) 3000 units TABS Take 3,000 Units by mouth daily.    cholestyramine (QUESTRAN) 4 g packet MIX 1 PACKET WITH LIQUID AND  DRINK BY MOUTH DAILY IN THE  AFTERNOON. TAKE 2 HOURS BEFORE  OR AFTER ALL OTHER MEDICATIONS   cycloSPORINE (RESTASIS) 0.05 % ophthalmic emulsion Place 1 drop into both eyes 2 (two) times daily.   fexofenadine (ALLEGRA) 180 MG tablet Take 180 mg by mouth daily.   fluticasone (FLONASE) 50 MCG/ACT nasal spray Place 2 sprays into both nostrils.   loperamide (IMODIUM A-D) 2 MG tablet Take 1-3 tablets by mouth as directed.   meloxicam (MOBIC) 7.5 MG tablet Take 7.5-15 mg by mouth daily.   Misc Natural Products (OSTEO BI-FLEX TRIPLE STRENGTH PO) Take 1 tablet by mouth daily.   montelukast (SINGULAIR) 10 MG tablet Take 10 mg by mouth daily.   Multiple Vitamins-Minerals (MULTIVITAMINS THER. W/MINERALS) TABS Take 1 tablet by mouth daily.        Allergies:   Sulfa antibiotics, Elemental sulfur, Celebrex [celecoxib], Ciprofloxacin, Levofloxacin, Metronidazole, and Promethazine hcl   Social History   Socioeconomic History   Marital status: Married    Spouse name: Not on file   Number of children: 2   Years of education: Not on file   Highest education level: Not on file  Occupational History   Not on file  Tobacco Use   Smoking status: Never   Smokeless tobacco: Never  Vaping Use   Vaping status: Never Used  Substance and Sexual Activity   Alcohol use: No   Drug use: No   Sexual activity: Not on file  Other Topics Concern   Not on file  Social History Narrative   Not on file   Social Determinants of Health   Financial Resource Strain: Low  Risk  (03/29/2022)   [facility] from [facility], [facility] visits prior to 09/23/2022., [facility] visits prior to 09/23/2022.   Overall Financial Resource Strain (CARDIA)    Difficulty of Paying Living Expenses: Not hard at all  Food Insecurity: Low Risk  (02/25/2023)   [facility]   Food vital sign    Within the past 12 months, you worried that your food would run out before you got money to buy more: Never true    Within the past 12 months, the food you bought just didn't last and you didn't have money to get more. : Never true  Transportation Needs: Not on file (02/25/2023)  Physical Activity: Unknown (03/29/2022)   [facility] prior to 09/23/2022., [facility] visits prior to 09/23/2022.   Exercise Vital Sign    Days of Exercise per Week: Patient declined    Minutes of Exercise per Session: Not on file  Stress: No Stress Concern Present (03/29/2022)   [facility], [facility] visits prior to 09/23/2022., [facility] visits prior to 09/23/2022.   [employer] of Occupational Health -  Occupational Stress Questionnaire    Feeling of Stress : Only a little  Social Connections: Socially Integrated (03/29/2022)   [facility], [facility] visits prior to 09/23/2022., [facility] visits prior to 09/23/2022.   Social Connection and Isolation Panel [NHANES]    Frequency of Communication with Friends and Family: More than three times a week    Frequency of Social Gatherings with Friends and Family: Three times a week    Attends Religious Services: More than 4 times per year    Active Member of Clubs or Organizations: Yes    Attends Banker Meetings: More than 4 times per year    Marital Status: Married     Family History: The patient's family history is negative for Anesthesia problems and Colon cancer. ROS:   Please see the history of present illness.    All 14 point review of systems negative except as described per history of present illness.  EKGs/Labs/Other Studies Reviewed:    The following studies were reviewed today: Labs from 02/26/2023 reviewed. Sodium 139, potassium 4, creatinine 0.77, EGFR 79  TSH normal 2.5 High-sensitivity troponin 4 Hemoglobin 13, hematocrit 38.7, WBC 10, platelets 247 Last lipid panel available to review from October 10, 2022 total cholesterol 169 triglycerides 200, HDL 50, LDL 89, non-HDL 119 Triglycerides were up to 358 back in January 2022.    EKG in the clinic today shows sinus rhythm, heart rate 73/min, PR interval 192 ms.  [facility]A chest done at [facility] February 27, 2023 noted no evidence of pulmonary embolism.  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   Physical Exam:    VS:  BP 128/80 (BP Location: Right Arm, Patient Position: Sitting, Cuff Size: Normal)   Pulse 73   Ht 4\' 10"  (1.473 m)   Wt 146 lb 6.4 oz (66.4 kg)   SpO2 99%   BMI 30.60 kg/m     Wt Readings from Last 3 Encounters:   03/02/23 146 lb 6.4 oz (66.4 kg)  02/07/22 151 lb (68.5 kg)  11/20/19 144 lb (65.3 kg)     GENERAL:  Well nourished, well  developed in no acute distress NECK: No JVD; No carotid bruits CARDIAC: RRR, no murmurs, no rubs, no gallops CHEST:  Clear to auscultation without rales, wheezing or rhonchi  Extremities: No pitting pedal edema. Pulses bilaterally symmetric with radial 2+ and dorsalis pedis 2+ NEUROLOGIC:  Alert and oriented x 3   ASSESSMENT AND PLAN:    Problem List Items Addressed This Visit     Dyspnea on exertion over the past month and a half    Mild intensity symptoms with exertion over the past month and a half.  Onset temporally correlated with mechanical fall a month ago.   Proceed with further evalution with transthoracic echocardiogram and treadmill stress test with nuclear imaging.  Will review results once available. Advised to monitor symptoms and if progressive or persisting longer duration or with minimal effort to notify us immediately.  Follow up in clinic based on test results.       Fatigue    Correlates with her exertional symptoms as above.  Workup to rule out cardiac causes as above.       Other Visit Diagnoses     Shortness of breath    -  Primary   Relevant Orders   EKG 12-Lead   ECHOCARDIOGRAM COMPLETE   MYOCARDIAL PERFUSION IMAGING   Dizziness       Relevant Orders   EKG 12-Lead   ECHOCARDIOGRAM COMPLETE   MYOCARDIAL PERFUSION IMAGING        Medication Adjustments/Labs and Tests Ordered: Current medicines are reviewed at length with the patient today.  Concerns regarding medicines are outlined above.  Orders Placed This Encounter  Procedures   MYOCARDIAL PERFUSION IMAGING   EKG 12-Lead   ECHOCARDIOGRAM COMPLETE   No orders of the defined types were placed in this encounter.   Signed, Cecille Amsterdam, MD, MPH, [facility]. 03/02/2023 9:00 AM    [facility]

## 2023-03-06 ENCOUNTER — Telehealth: Payer: Self-pay

## 2023-03-06 NOTE — Telephone Encounter (Signed)
Spoke with the patient, detailed instructions given. She stated that she would be here for her test in the am. Asked to call back with any questions. S. EMTP/CCT

## 2023-03-07 ENCOUNTER — Ambulatory Visit: Payer: Medicare Other

## 2023-03-07 DIAGNOSIS — R42 Dizziness and giddiness: Secondary | ICD-10-CM | POA: Diagnosis present

## 2023-03-07 DIAGNOSIS — R0602 Shortness of breath: Secondary | ICD-10-CM | POA: Diagnosis present

## 2023-03-07 MED ORDER — TECHNETIUM TC 99M TETROFOSMIN IV KIT
10.9000 | PACK | Freq: Once | INTRAVENOUS | Status: AC | PRN
  Administered 2023-03-07: 10.9 via INTRAVENOUS

## 2023-03-07 MED ORDER — TECHNETIUM TC 99M TETROFOSMIN IV KIT
30.4000 | PACK | Freq: Once | INTRAVENOUS | Status: AC | PRN
  Administered 2023-03-07: 30.4 via INTRAVENOUS

## 2023-03-07 MED ORDER — REGADENOSON 0.4 MG/5ML IV SOLN
0.4000 mg | Freq: Once | INTRAVENOUS | Status: AC
Start: 2023-03-07 — End: 2023-03-07
  Administered 2023-03-07: 0.4 mg via INTRAVENOUS

## 2023-03-08 LAB — MYOCARDIAL PERFUSION IMAGING
LV dias vol: 55 mL (ref 46–106)
LV sys vol: 18 mL
Nuc Stress EF: 68 %
Peak HR: 109 {beats}/min
Rest HR: 66 {beats}/min
Rest Nuclear Isotope Dose: 10.9 mCi
SDS: 2
SRS: 3
SSS: 5
Stress Nuclear Isotope Dose: 30.4 mCi
TID: 1.02

## 2023-03-09 ENCOUNTER — Ambulatory Visit: Payer: Medicare Other

## 2023-03-09 ENCOUNTER — Other Ambulatory Visit: Payer: Self-pay

## 2023-03-09 VITALS — BP 148/88 | HR 84 | Ht <= 58 in | Wt 149.2 lb

## 2023-03-09 DIAGNOSIS — R9439 Abnormal result of other cardiovascular function study: Secondary | ICD-10-CM | POA: Diagnosis not present

## 2023-03-09 DIAGNOSIS — R0609 Other forms of dyspnea: Secondary | ICD-10-CM

## 2023-03-09 HISTORY — DX: Abnormal result of other cardiovascular function study: R94.39

## 2023-03-09 MED ORDER — ATORVASTATIN CALCIUM 20 MG PO TABS
20.0000 mg | ORAL_TABLET | Freq: Every day | ORAL | 3 refills | Status: DC
Start: 2023-03-09 — End: 2023-04-10

## 2023-03-09 MED ORDER — ASPIRIN 81 MG PO TBEC: 81.0000 mg | DELAYED_RELEASE_TABLET | Freq: Every day | ORAL | 3 refills | Status: DC

## 2023-03-09 NOTE — Assessment & Plan Note (Signed)
Discussed further workup with cardiac catheter and coronary angiogram with/without PCI.  Went over the details of the procedure at length.  Shared Decision Making/Informed Consent{ The risks [stroke (1 in 1000), death (1 in 1000), kidney failure [usually temporary] (1 in 500), bleeding (1 in 200), allergic reaction [possibly serious] (1 in 200)], benefits (diagnostic support and management of coronary artery disease) and alternatives of a cardiac catheterization were discussed in detail with patient and her husband. She is willing to proceed.  Orders done. Will start aspirin 81mg  once daily and atorvastastin 20mg  once daily. Reviewed rationale for their use, potential side effect.   TTE scheduled for first week of August, will request to be completed sooner if possible.  Will f/u in office in 3 months.

## 2023-03-09 NOTE — Progress Notes (Signed)
Cardiology Office Note:    Date:  03/09/2023   ID:  Chelsea, Gregory July 02, 1946, [name]  PCP:  Krystal Clark, NP  Cardiologist:  Marlyn Corporal Chelsea Myrick, MD    Referring MD: Rhea Bleacher*   No chief complaint on file.   History of Present Illness:    Chelsea Gregory is a 77 y.o. female here for follow-up visit today after recent office visit on August 9 with abnormal stress further nuclear imaging results.  Here for the visit today accompanied by her husband. Mentions she continues to feel symptoms of dyspnea with exertion.  Denies any significant change over the last week.  Stress findings consistent with ischemic with a medium size defect and reduced uptake in the anterior wall segments.  Recent CT head report now available to review shows no    Past Medical History:  Diagnosis Date   Arthritis    Knees   Chronic kidney disease    kidney infection post colon surgery on Macrodantin   Diverticular disease of left colon    GERD (gastroesophageal reflux disease)    Headache(784.0)    occasionally  migraines   MRSA colonization 2009   Neuromuscular disorder (HCC)    Cervical disc   Seasonal allergies    Sleep apnea    wear CPAP  tested > 3 years ago    Past Surgical History:  Procedure Laterality Date   ABDOMINAL HYSTERECTOMY  2006   ANTERIOR LAT LUMBAR FUSION Left 03/14/2016   Procedure: Extreme Lumbar Interbody Fusion Left Lumbar Two-Three,Lumbar Three-four,Lumbar Four-Five with Pedicle Screws;  Surgeon: Julio Sicks, MD;  Location: [facility];  Service: Neurosurgery;  Laterality: Left;  left approach   BACK SURGERY     CERVICAL DISCECTOMY  2010   CHOLECYSTECTOMY  2000   COLON SURGERY  2009   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, aso  had left ovary removed    Colosotmy  2009   COLOSTOMY CLOSURE  2010   EYE SURGERY     Implant to tear duct   HERNIA REPAIR  12/2009   Right   HERNIA REPAIR  2012   Left   JOINT REPLACEMENT Left  2015   LUMBAR LAMINECTOMY/DECOMPRESSION MICRODISCECTOMY  08/14/2011   Procedure: LUMBAR LAMINECTOMY/DECOMPRESSION MICRODISCECTOMY;  Surgeon: Temple Pacini, MD;  Location: [facility];  Service: Neurosurgery;  Laterality: Left;  Left Lumbar three-four, Bilateral Lumbar four-five decompressive lumbar laminectomy   LUMBAR PERCUTANEOUS PEDICLE SCREW 3 LEVEL Left 03/14/2016   Procedure: LUMBAR PERCUTANEOUS PEDICLE SCREW 3 LEVEL;  Surgeon: Julio Sicks, MD;  Location: [facility];  Service: Neurosurgery;  Laterality: Left;   TUBAL LIGATION  1968    Current Medications: Current Meds  Medication Sig   acetaminophen (TYLENOL 8 HOUR ARTHRITIS PAIN) 650 MG CR tablet Take 2,600 mg by mouth daily.   alendronate (FOSAMAX) 70 MG tablet Take 70 mg by mouth every Monday. Take with a full glass of water on an empty stomach.   ALPRAZolam (XANAX) 0.5 MG tablet Take 0.5-1 mg by mouth at bedtime as needed for anxiety.   AMBULATORY NON FORMULARY MEDICATION every 30 (thirty) days. Allergy Shot   AMBULATORY NON FORMULARY MEDICATION 3 tablets daily. Mannose   butalbital-acetaminophen-caffeine (FIORICET) 50-325-40 MG tablet Take 1 tablet by mouth as needed for headache.   Cholecalciferol (VITAMIN D3) 3000 units TABS Take 3,000 Units by mouth daily.    cholestyramine (QUESTRAN) 4 g packet MIX 1 PACKET WITH LIQUID AND  DRINK BY MOUTH DAILY IN  THE  AFTERNOON. TAKE 2 HOURS BEFORE  OR AFTER ALL OTHER MEDICATIONS   cycloSPORINE (RESTASIS) 0.05 % ophthalmic emulsion Place 1 drop into both eyes 2 (two) times daily.   estradiol (ESTRACE) 0.1 MG/GM vaginal cream Place 1 Applicatorful vaginally 3 (three) times a week.   fexofenadine (ALLEGRA) 180 MG tablet Take 180 mg by mouth daily.   fluticasone (FLONASE) 50 MCG/ACT nasal spray Place 2 sprays into both nostrils.   loperamide (IMODIUM A-D) 2 MG tablet Take 1-3 tablets by mouth as directed.   meloxicam (MOBIC) 7.5 MG tablet Take 7.5-15 mg by mouth daily.   Misc Natural Products  (OSTEO BI-FLEX TRIPLE STRENGTH PO) Take 1 tablet by mouth daily.   montelukast (SINGULAIR) 10 MG tablet Take 10 mg by mouth daily.   Multiple Vitamins-Minerals (MULTIVITAMINS THER. W/MINERALS) TABS Take 1 tablet by mouth daily.       Allergies:   Sulfa antibiotics, Elemental sulfur, Celebrex [celecoxib], Ciprofloxacin, Levofloxacin, Metronidazole, and Promethazine hcl   Social History   Socioeconomic History   Marital status: Married    Spouse name: Not on file   Number of children: 2   Years of education: Not on file   Highest education level: Not on file  Occupational History   Not on file  Tobacco Use   Smoking status: Never    Passive exposure: Never   Smokeless tobacco: Never  Vaping Use   Vaping status: Never Used  Substance and Sexual Activity   Alcohol use: No   Drug use: No   Sexual activity: Not on file  Other Topics Concern   Not on file  Social History Narrative   Not on file   Social Determinants of Health   Financial Resource Strain: Low Risk  (03/29/2022)   Received from [facility], [facility] visits prior to 09/23/2022., [facility] visits prior to 09/23/2022.   Overall Financial Resource Strain (CARDIA)    Difficulty of Paying Living Expenses: Not hard at all  Food Insecurity: Low Risk  (02/25/2023)   [facility] from [facility]   Food vital sign    Within the past 12 months, you worried that your food would run out before you got money to buy more: Never true    Within the past 12 months, the food you bought just didn't last and you didn't have money to get more. : Never true  Transportation Needs: Not on file (02/25/2023)  Physical Activity: Unknown (03/29/2022)   [facility], [facility] visits prior to 09/23/2022., [facility] visits prior to 09/23/2022.   Exercise Vital Sign    Days of Exercise per Week: Patient declined    Minutes of Exercise per Session:  Not on file  Stress: No Stress Concern Present (03/29/2022)   [facility], [facility] visits prior to 09/23/2022., [facility] visits prior to 09/23/2022.   [employer] of Occupational Health - Occupational Stress Questionnaire    Feeling of Stress : Only a little  Social Connections: Socially Integrated (03/29/2022)   Received from [facility], [facility] visits prior to 09/23/2022., [facility] visits prior to 09/23/2022.   Social Connection and Isolation Panel [NHANES]    Frequency of Communication with Friends and Family: More than three times a week    Frequency of Social Gatherings with Friends and Family: Three times a week  Attends Religious Services: More than 4 times per year    Active Member of Clubs or Organizations: Yes    Attends Banker Meetings: More than 4 times per year    Marital Status: Married     Family History: The patient's family history is negative for Anesthesia problems and Colon cancer. ROS:   Please see the history of present illness.    All 14 point review of systems negative except as described per history of present illness  EKGs/Labs/Other Studies Reviewed:         Recent Labs: No results found for requested labs within last 365 days.  Recent Lipid Panel No results found for: "CHOL", "TRIG", "HDL", "CHOLHDL", "VLDL", "LDLCALC", "LDLDIRECT"  Physical Exam:    VS:  BP (!) 148/88   Pulse 84   Ht 4\' 10"  (1.473 m)   Wt 149 lb 3.2 oz (67.7 kg)   SpO2 95%   BMI 31.18 kg/m      Wt Readings from Last 3 Encounters:  03/09/23 149 lb 3.2 oz (67.7 kg)  03/07/23 146 lb (66.2 kg)  03/02/23 146 lb 6.4 oz (66.4 kg)     GENERAL:  Well nourished, well developed in no acute distress CARDIAC: RRR, S1 and S2 present, no murmurs, no rubs, no gallops CHEST:  Clear to auscultation without rales, wheezing or rhonchi  NEUROLOGIC:  Alert and  oriented x 3   ASSESSMENT AND PLAN:   Ms. Scheidel 76/F seen for f/u to discuss abnormal stress MPI results today. Last office visit with [place] on August 9th.  Problem List Items Addressed This Visit     Dyspnea on exertion over the past month and a half   Abnormal cardiovascular stress test - Primary    Discussed further workup with cardiac catheter and coronary angiogram with/without PCI.  Went over the details of the procedure at length.  Shared Decision Making/Informed Consent{ The risks [stroke (1 in 1000), death (1 in 1000), kidney failure [usually temporary] (1 in 500), bleeding (1 in 200), allergic reaction [possibly serious] (1 in 200)], benefits (diagnostic support and management of coronary artery disease) and alternatives of a cardiac catheterization were discussed in detail with patient and her husband. She is willing to proceed.  Orders done. Will start aspirin 81mg  once daily and atorvastastin 20mg  once daily. Reviewed rationale for their use, potential side effect.   TTE scheduled for first week of August, will request to be completed sooner if possible.  Will f/u in office in 3 months.         Medication Adjustments/Labs and Tests Ordered: Current medicines are reviewed at length with the patient today.  Concerns regarding medicines are outlined above.  No orders of the defined types were placed in this encounter.  Medication changes: No orders of the defined types were placed in this encounter.   Signed, Cecille Amsterdam, MD, MPH, [facility] 03/09/2023 3:19 PM    [facility]

## 2023-03-09 NOTE — Patient Instructions (Signed)
Medication Instructions:  Your physician has recommended you make the following change in your medication:   Take 81 mg coated aspirin daily.  Start 20 mg Atorvastatin daily.  *If you need a refill on your cardiac medications before your next appointment, please call your pharmacy*   Lab Work: Your physician recommends that you have a BMET and CBC today in the office for your upcoming procedure.  If you have labs (blood work) drawn today and your tests are completely normal, you will receive your results only by: MyChart Message (if you have MyChart) OR A paper copy in the mail If you have any lab test that is abnormal or we need to change your treatment, we will call you to review the results.   Testing/Procedures:  [place] [facility] DEPT OF [facility] [place] [address] [place] [address] Dept: [phone] Loc: [phone]  AHAVA DEMELLO  03/09/2023   2. Diet: Do not eat solid foods after midnight.  The patient may have clear liquids until 5am upon the day of the procedure.  3. Labs: You will need to have blood drawn on the week of your cath.  4. Medication instructions in preparation for your procedure:   Contrast Allergy: No  You will need to hold your Mobic the day of the cath  On the morning of your procedure, take your Aspirin 81 mg and any morning medicines NOT listed above.  You may use sips of water.  5. Plan to go home the same day, you will only stay overnight if medically necessary. 6. Bring a current list of your medications and current insurance cards. 7. You MUST have a responsible person to drive you home. 8. Someone MUST be with you the first 24 hours after you arrive home or your discharge will be delayed. 9. Please wear clothes that are easy to get on and off and wear slip-on shoes.  Thank you for allowing [place] to care for you!   -- [facility]  [facility]    Follow-Up: At [facility], you and your health needs are our priority.  As part of our continuing mission to provide you with exceptional heart care, we have created designated Provider Care Teams.  These Care Teams include your primary Cardiologist (physician) and Advanced Practice Providers (APPs -  Physician Assistants and Nurse Practitioners) who all work together to provide you with the care you need, when you need it.  We recommend signing up for the patient portal called "MyChart".  Sign up information is provided on this After Visit Summary.  MyChart is used to connect with patients for Virtual Visits (Telemedicine).  Patients are able to view lab/test results, encounter notes, upcoming appointments, etc.  Non-urgent messages can be sent to your provider as well.   To learn more about what you can do with MyChart, go to [email or web address].    Your next appointment:   As directed  The format for your next appointment:   In Person  Provider:   Dr. Vincent Gros   Other Instructions  Coronary Angiogram With Stent Coronary angiogram with stent placement is a procedure to widen or open a narrow blood vessel of the heart (coronary artery). Arteries may become blocked by cholesterol buildup (plaques) in the lining of the artery wall. When a coronary artery becomes partially blocked, blood flow to that area decreases. This may lead to chest pain or a heart attack (myocardial infarction). A stent  is a small piece of metal that looks like mesh or spring. Stent placement may be done as treatment after a heart attack, or to prevent a heart attack if a blocked artery is found by a coronary angiogram. Let your health care provider know about: Any allergies you have, including allergies to medicines or contrast dye. All medicines you are taking, including vitamins, herbs, eye drops, creams, and over-the-counter medicines. Any problems you or family members have had with  anesthetic medicines. Any blood disorders you have. Any surgeries you have had. Any medical conditions you have, including kidney problems or kidney failure. Whether you are pregnant or may be pregnant. Whether you are breastfeeding. What are the risks? Generally, this is a safe procedure. However, serious problems may occur, including: Damage to nearby structures or organs, such as the heart, blood vessels, or kidneys. A return of blockage. Bleeding, infection, or bruising at the insertion site. A collection of blood under the skin (hematoma) at the insertion site. A blood clot in another part of the body. Allergic reaction to medicines or dyes. Bleeding into the abdomen (retroperitoneal bleeding). Stroke (rare). Heart attack (rare). What happens before the procedure? Staying hydrated Follow instructions from your health care provider about hydration, which may include: Up to 2 hours before the procedure - you may continue to drink clear liquids, such as water, clear fruit juice, black coffee, and plain tea.    Eating and drinking restrictions Follow instructions from your health care provider about eating and drinking, which may include: 8 hours before the procedure - stop eating heavy meals or foods, such as meat, fried foods, or fatty foods. 6 hours before the procedure - stop eating light meals or foods, such as toast or cereal. 2 hours before the procedure - stop drinking clear liquids. Medicines Ask your health care provider about: Changing or stopping your regular medicines. This is especially important if you are taking diabetes medicines or blood thinners. Taking medicines such as aspirin and ibuprofen. These medicines can thin your blood. Do not take these medicines unless your health care provider tells you to take them. Generally, aspirin is recommended before a thin tube, called a catheter, is passed through a blood vessel and inserted into the heart (cardiac  catheterization). Taking over-the-counter medicines, vitamins, herbs, and supplements. General instructions Do not use any products that contain nicotine or tobacco for at least 4 weeks before the procedure. These products include cigarettes, e-cigarettes, and chewing tobacco. If you need help quitting, ask your health care provider. Plan to have someone take you home from the hospital or clinic. If you will be going home right after the procedure, plan to have someone with you for 24 hours. You may have tests and imaging procedures. Ask your health care provider: How your insertion site will be marked. Ask which artery will be used for the procedure. What steps will be taken to help prevent infection. These may include: Removing hair at the insertion site. Washing skin with a germ-killing soap. Taking antibiotic medicine. What happens during the procedure? An IV will be inserted into one of your veins. Electrodes may be placed on your chest to monitor your heart rate during the procedure. You will be given one or more of the following: A medicine to help you relax (sedative). A medicine to numb the area (local anesthetic) for catheter insertion. A small incision will be made for catheter insertion. The catheter will be inserted into an artery using a guide wire. The location  may be in your groin, your wrist, or the fold of your arm (near your elbow). An X-ray procedure (fluoroscopy) will be used to help guide the catheter to the opening of the heart arteries. A dye will be injected into the catheter. X-rays will be taken. The dye helps to show where any narrowing or blockages are located in the arteries. Tell your health care provider if you have chest pain or trouble breathing. A tiny wire will be guided to the blocked spot, and a balloon will be inflated to make the artery wider. The stent will be expanded to crush the plaques into the wall of the vessel. The stent will hold the area open  and improve the blood flow. Most stents have a drug coating to reduce the risk of the stent narrowing over time. The artery may be made wider using a drill, laser, or other tools that remove plaques. The catheter will be removed when the blood flow improves. The stent will stay where it was placed, and the lining of the artery will grow over it. A bandage (dressing) will be placed on the insertion site. Pressure will be applied to stop bleeding. The IV will be removed. This procedure may vary among health care providers and hospitals.    What happens after the procedure? Your blood pressure, heart rate, breathing rate, and blood oxygen level will be monitored until you leave the hospital or clinic. If the procedure is done through the leg, you will lie flat in bed for a few hours or for as long as told by your health care provider. You will be instructed not to bend or cross your legs. The insertion site and the pulse in your foot or wrist will be checked often. You may have more blood tests, X-rays, and a test that records the electrical activity of your heart (electrocardiogram, or ECG). Do not drive for 24 hours if you were given a sedative during your procedure. Summary Coronary angiogram with stent placement is a procedure to widen or open a narrowed coronary artery. This is done to treat heart problems. Before the procedure, let your health care provider know about all the medical conditions and surgeries you have or have had. This is a safe procedure. However, some problems may occur, including damage to nearby structures or organs, bleeding, blood clots, or allergies. Follow your health care provider's instructions about eating, drinking, medicines, and other lifestyle changes, such as quitting tobacco use before the procedure. This information is not intended to replace advice given to you by your health care provider. Make sure you discuss any questions you have with your health care  provider. Document Revised: 01/29/2019 Document Reviewed: 01/29/2019 Elsevier Patient Education  2021 Elsevier Inc.  Aspirin and Your Heart Aspirin is a medicine that prevents the platelets in your blood from sticking together. Platelets are the cells that your blood uses for clotting. Aspirin can be used to help reduce the risk of blood clots, heart attacks, and other heart-related problems. What are the risks? Daily use of aspirin can cause side effects. Some of these include: Bleeding. Bleeding can be minor or serious. An example of minor bleeding is bleeding from a cut, and the bleeding does not stop. An example of more serious bleeding is stomach bleeding or, rarely, bleeding into the brain. Your risk of bleeding increases if you are also taking NSAIDs, such as ibuprofen. Increased bruising. Upset stomach. An allergic reaction. People who have growths inside the nose (nasal polyps) have  an increased risk of developing an aspirin allergy. How to use aspirin to care for your heart Take aspirin only as told by your health care provider. Make sure that you understand how much to take and what form to take. The two forms of aspirin are: Non-enteric-coated.This type of aspirin does not have a coating and is absorbed quickly. This type of aspirin also comes in a chewable form. Enteric-coated. This type of aspirin has a coating that releases the medicine very slowly. Enteric-coated aspirin might cause less stomach upset than non-enteric-coated aspirin. This type of aspirin should not be chewed or crushed. Work with your health care provider to find out whether it is safe and beneficial for you to take aspirin daily. Taking aspirin daily may be helpful if: You have had a heart attack or chest pain, or you are at risk for a heart attack. You have a condition in which certain heart vessels are blocked (coronary artery disease), and you have had a procedure to treat it. Examples are: Open-heart surgery,  such as coronary artery bypass surgery (CABG). Coronary angioplasty,which is done to widen a blood vessel of your heart. Having a small mesh tube, or stent, placed in your coronary artery. You have had certain types of stroke or a mini-stroke known as a transient ischemic attack (TIA). You have a narrowing of the arteries that supply the limbs (peripheral artery disease, or PAD). You have long-term (chronic) heart rhythm problems, such as atrial fibrillation, and your health care provider thinks aspirin may help. You have valve disease or have had surgery on a valve. You are considered at increased risk of developing coronary artery disease or PAD.    Follow these instructions at home Medicines Take over-the-counter and prescription medicines only as told by your health care provider. If you are taking blood thinners: Talk with your health care provider before you take any medicines that contain aspirin or NSAIDs, such as ibuprofen. These medicines increase your risk for dangerous bleeding. Take your medicine exactly as told, at the same time every day. Avoid activities that could cause injury or bruising, and follow instructions about how to prevent falls. Wear a medical alert bracelet or carry a card that lists what medicines you take. General instructions Do not drink alcohol if: Your health care provider tells you not to drink. You are pregnant, may be pregnant, or are planning to become pregnant. If you drink alcohol: Limit how much you use to: 0-1 drink a day for women. 0-2 drinks a day for men. Be aware of how much alcohol is in your drink. In the [place] one drink equals one 12 oz bottle of beer (355 mL), one 5 oz glass of wine (148 mL), or one 1 oz glass of hard liquor (44 mL). Keep all follow-up visits as told by your health care provider. This is important. Where to find more information The American Heart Association: www.heart.org Contact a health care provider if you  have: Unusual bleeding or bruising. Stomach pain or nausea. Ringing in your ears. An allergic reaction that causes hives, itchy skin, or swelling of the lips, tongue, or face. Get help right away if: You notice that your bowel movements are bloody, or dark red or black in color. You vomit or cough up blood. You have blood in your urine. You cough, breathe loudly (wheeze), or feel short of breath. You have chest pain, especially if the pain spreads to your arms, back, neck, or jaw. You have a headache with  confusion. You have any symptoms of a stroke. "BE FAST" is an easy way to remember the main warning signs of a stroke: B - Balance. Signs are dizziness, sudden trouble walking, or loss of balance. E - Eyes. Signs are trouble seeing or a sudden change in vision. F - Face. Signs are sudden weakness or numbness of the face, or the face or eyelid drooping on one side. A - Arms. Signs are weakness or numbness in an arm. This happens suddenly and usually on one side of the body. S - Speech. Signs are sudden trouble speaking, slurred speech, or trouble understanding what people say. T - Time. Time to call emergency services. Write down what time symptoms started. You have other signs of a stroke, such as: A sudden, severe headache with no known cause. Nausea or vomiting. Seizure. These symptoms may represent a serious problem that is an emergency. Do not wait to see if the symptoms will go away. Get medical help right away. Call your local emergency services (911 in the [place] Do not drive yourself to the hospital. Summary Aspirin use can help reduce the risk of blood clots, heart attacks, and other heart-related problems. Daily use of aspirin can cause side effects. Take aspirin only as told by your health care provider. Make sure that you understand how much to take and what form to take. Your health care provider will help you determine whether it is safe and beneficial for you to take  aspirin daily. This information is not intended to replace advice given to you by your health care provider. Make sure you discuss any questions you have with your health care provider. Document Revised: 04/14/2019 Document Reviewed: 04/14/2019 Elsevier Patient Education  2021 Elsevier Inc. Nitroglycerin sublingual tablets What is this medicine? NITROGLYCERIN (nye troe GLI ser in) is a type of vasodilator. It relaxes blood vessels, increasing the blood and oxygen supply to your heart. This medicine is used to relieve chest pain caused by angina. It is also used to prevent chest pain before activities like climbing stairs, going outdoors in cold weather, or sexual activity. This medicine may be used for other purposes; ask your health care provider or pharmacist if you have questions. COMMON BRAND NAME(S): Nitroquick, Nitrostat, Nitrotab What should I tell my health care provider before I take this medicine? They need to know if you have any of these conditions: anemia head injury, recent stroke, or bleeding in the brain liver disease previous heart attack an unusual or allergic reaction to nitroglycerin, other medicines, foods, dyes, or preservatives pregnant or trying to get pregnant breast-feeding How should I use this medicine? Take this medicine by mouth as needed. Use at the first sign of an angina attack (chest pain or tightness). You can also take this medicine 5 to 10 minutes before an event likely to produce chest pain. Follow the directions exactly as written on the prescription label. Place one tablet under your tongue and let it dissolve. Do not swallow whole. Replace the dose if you accidentally swallow it. It will help if your mouth is not dry. Saliva around the tablet will help it to dissolve more quickly. Do not eat or drink, smoke or chew tobacco while a tablet is dissolving. Sit down when taking this medicine. In an angina attack, you should feel better within 5 minutes after  your first dose. You can take a dose every 5 minutes up to a total of 3 doses. If you do not feel better or feel  worse after 1 dose, call 9-1-1 at once. Do not take more than 3 doses in 15 minutes. Your health care provider might give you other directions. Follow those directions if he or she does. Do not take your medicine more often than directed. Talk to your health care provider about the use of this medicine in children. Special care may be needed. Overdosage: If you think you have taken too much of this medicine contact a poison control center or emergency room at once. NOTE: This medicine is only for you. Do not share this medicine with others. What if I miss a dose? This does not apply. This medicine is only used as needed. What may interact with this medicine? Do not take this medicine with any of the following medications: certain migraine medicines like ergotamine and dihydroergotamine (DHE) medicines used to treat erectile dysfunction like sildenafil, tadalafil, and vardenafil riociguat This medicine may also interact with the following medications: alteplase aspirin heparin medicines for high blood pressure medicines for mental depression other medicines used to treat angina phenothiazines like chlorpromazine, mesoridazine, prochlorperazine, thioridazine This list may not describe all possible interactions. Give your health care provider a list of all the medicines, herbs, non-prescription drugs, or dietary supplements you use. Also tell them if you smoke, drink alcohol, or use illegal drugs. Some items may interact with your medicine. What should I watch for while using this medicine? Tell your doctor or health care professional if you feel your medicine is no longer working. Keep this medicine with you at all times. Sit or lie down when you take your medicine to prevent falling if you feel dizzy or faint after using it. Try to remain calm. This will help you to feel better  faster. If you feel dizzy, take several deep breaths and lie down with your feet propped up, or bend forward with your head resting between your knees. You may get drowsy or dizzy. Do not drive, use machinery, or do anything that needs mental alertness until you know how this drug affects you. Do not stand or sit up quickly, especially if you are an older patient. This reduces the risk of dizzy or fainting spells. Alcohol can make you more drowsy and dizzy. Avoid alcoholic drinks. Do not treat yourself for coughs, colds, or pain while you are taking this medicine without asking your doctor or health care professional for advice. Some ingredients may increase your blood pressure. What side effects may I notice from receiving this medicine? Side effects that you should report to your doctor or health care professional as soon as possible: allergic reactions (skin rash, itching or hives; swelling of the face, lips, or tongue) low blood pressure (dizziness; feeling faint or lightheaded, falls; unusually weak or tired) low red blood cell counts (trouble breathing; feeling faint; lightheaded, falls; unusually weak or tired) Side effects that usually do not require medical attention (report to your doctor or health care professional if they continue or are bothersome): facial flushing (redness) headache nausea, vomiting This list may not describe all possible side effects. Call your doctor for medical advice about side effects. You may report side effects to FDA at 1-800-FDA-1088. Where should I keep my medicine? Keep out of the reach of children. Store at room temperature between 20 and 25 degrees C (68 and 77 degrees F). Store in Retail buyer. Protect from light and moisture. Keep tightly closed. Throw away any unused medicine after the expiration date. NOTE: This sheet is a summary. It may not  cover all possible information. If you have questions about this medicine, talk to your doctor,  pharmacist, or health care provider.  2021 Elsevier/Gold Standard (2018-04-10 16:46:32)

## 2023-03-14 DIAGNOSIS — R0600 Dyspnea, unspecified: Secondary | ICD-10-CM | POA: Diagnosis not present

## 2023-03-19 ENCOUNTER — Other Ambulatory Visit: Payer: Self-pay

## 2023-03-19 ENCOUNTER — Encounter (HOSPITAL_COMMUNITY): Payer: Self-pay

## 2023-03-19 ENCOUNTER — Encounter (HOSPITAL_COMMUNITY): Payer: Self-pay | Admitting: Cardiology

## 2023-03-19 ENCOUNTER — Observation Stay (HOSPITAL_COMMUNITY)
Admission: RE | Admit: 2023-03-19 | Discharge: 2023-03-20 | Disposition: A | Discharge status: Home / Self Care | Payer: Medicare Other | Source: Other Acute Inpatient Hospital | Attending: Internal Medicine | Admitting: Internal Medicine

## 2023-03-19 ENCOUNTER — Ambulatory Visit: Payer: Medicare Other

## 2023-03-19 DIAGNOSIS — Z7982 Long term (current) use of aspirin: Secondary | ICD-10-CM | POA: Insufficient documentation

## 2023-03-19 DIAGNOSIS — Z79899 Other long term (current) drug therapy: Secondary | ICD-10-CM | POA: Diagnosis not present

## 2023-03-19 DIAGNOSIS — I200 Unstable angina: Secondary | ICD-10-CM

## 2023-03-19 DIAGNOSIS — I5032 Chronic diastolic (congestive) heart failure: Secondary | ICD-10-CM | POA: Insufficient documentation

## 2023-03-19 DIAGNOSIS — R0609 Other forms of dyspnea: Secondary | ICD-10-CM

## 2023-03-19 DIAGNOSIS — N189 Chronic kidney disease, unspecified: Secondary | ICD-10-CM | POA: Insufficient documentation

## 2023-03-19 DIAGNOSIS — I130 Hypertensive heart and chronic kidney disease with heart failure and stage 1 through stage 4 chronic kidney disease, or unspecified chronic kidney disease: Secondary | ICD-10-CM | POA: Insufficient documentation

## 2023-03-19 DIAGNOSIS — I5033 Acute on chronic diastolic (congestive) heart failure: Secondary | ICD-10-CM | POA: Diagnosis not present

## 2023-03-19 DIAGNOSIS — E785 Hyperlipidemia, unspecified: Secondary | ICD-10-CM | POA: Diagnosis not present

## 2023-03-19 HISTORY — DX: Unstable angina: I20.0

## 2023-03-19 LAB — TROPONIN I (HIGH SENSITIVITY)
Troponin I (High Sensitivity): 41 ng/L — ABNORMAL HIGH (ref <18)
Troponin I (High Sensitivity): 33 ng/L — ABNORMAL HIGH (ref <18)

## 2023-03-19 LAB — TSH: TSH: 3.883 u[IU]/mL (ref 0.350–4.500)

## 2023-03-19 LAB — T4, FREE: Free T4: 0.89 ng/dL (ref 0.61–1.12)

## 2023-03-19 LAB — HEMOGLOBIN A1C
Hgb A1c MFr Bld: 5.1 % (ref 4.8–5.6)
Mean Plasma Glucose: 99.67 mg/dL

## 2023-03-19 LAB — SURGICAL PCR SCREEN
MRSA, PCR: NEGATIVE
Staphylococcus aureus: NEGATIVE

## 2023-03-19 LAB — MAGNESIUM: Magnesium: 2 mg/dL (ref 1.7–2.4)

## 2023-03-19 MED ORDER — ASPIRIN 300 MG RE SUPP
300.0000 mg | RECTAL | Status: DC
  Filled 2023-03-19: qty 1

## 2023-03-19 MED ORDER — ONDANSETRON HCL 4 MG/2ML IJ SOLN: 4.0000 mg | Freq: Four times a day (QID) | INTRAMUSCULAR | Status: DC | PRN

## 2023-03-19 MED ORDER — HEPARIN (PORCINE) 25000 UT/250ML-% IV SOLN
800.0000 [IU]/h | INTRAVENOUS | Status: DC
  Administered 2023-03-19: 800 [IU]/h via INTRAVENOUS
  Filled 2023-03-19: qty 250

## 2023-03-19 MED ORDER — ACETAMINOPHEN 325 MG PO TABS
650.0000 mg | ORAL_TABLET | ORAL | Status: DC | PRN
  Administered 2023-03-20: 650 mg via ORAL
  Filled 2023-03-19: qty 2

## 2023-03-19 MED ORDER — ATORVASTATIN CALCIUM 10 MG PO TABS
20.0000 mg | ORAL_TABLET | Freq: Every day | ORAL | Status: DC
  Administered 2023-03-20: 20 mg via ORAL
  Filled 2023-03-19: qty 2

## 2023-03-19 MED ORDER — MUPIROCIN 2 % EX OINT
1.0000 | TOPICAL_OINTMENT | Freq: Two times a day (BID) | CUTANEOUS | Status: DC
  Administered 2023-03-19 – 2023-03-20 (×2): 1 via NASAL
  Filled 2023-03-19: qty 22

## 2023-03-19 MED ORDER — SODIUM CHLORIDE 0.9 % WEIGHT BASED INFUSION
3.0000 mL/kg/h | INTRAVENOUS | Status: DC
  Administered 2023-03-20: 3 mL/kg/h via INTRAVENOUS

## 2023-03-19 MED ORDER — ASPIRIN 81 MG PO CHEW: 324.0000 mg | CHEWABLE_TABLET | ORAL | Status: DC

## 2023-03-19 MED ORDER — SODIUM CHLORIDE 0.9 % WEIGHT BASED INFUSION: 1.0000 mL/kg/h | INTRAVENOUS | Status: DC

## 2023-03-19 MED ORDER — SODIUM CHLORIDE 0.9 % IV SOLN: INTRAVENOUS | Status: DC

## 2023-03-19 MED ORDER — DOXYCYCLINE HYCLATE 100 MG PO TABS
100.0000 mg | ORAL_TABLET | Freq: Two times a day (BID) | ORAL | Status: DC
  Administered 2023-03-19 – 2023-03-20 (×2): 100 mg via ORAL
  Filled 2023-03-19 (×2): qty 1

## 2023-03-19 MED ORDER — ALPRAZOLAM 0.5 MG PO TABS
0.5000 mg | ORAL_TABLET | Freq: Every evening | ORAL | Status: DC | PRN
  Administered 2023-03-19: 0.5 mg via ORAL
  Filled 2023-03-19: qty 1

## 2023-03-19 MED ORDER — BUTALBITAL-APAP-CAFFEINE 50-325-40 MG PO TABS: 1.0000 | ORAL_TABLET | ORAL | Status: DC | PRN

## 2023-03-19 MED ORDER — CHOLESTYRAMINE 4 G PO PACK
4.0000 g | PACK | Freq: Every day | ORAL | Status: DC
  Administered 2023-03-20: 4 g via ORAL
  Filled 2023-03-19 (×2): qty 1

## 2023-03-19 MED ORDER — NITROGLYCERIN 0.4 MG SL SUBL: 0.4000 mg | SUBLINGUAL_TABLET | SUBLINGUAL | Status: DC | PRN

## 2023-03-19 MED ORDER — METOPROLOL TARTRATE 12.5 MG HALF TABLET
12.5000 mg | ORAL_TABLET | Freq: Two times a day (BID) | ORAL | Status: DC
  Administered 2023-03-19 – 2023-03-20 (×2): 12.5 mg via ORAL
  Filled 2023-03-19 (×2): qty 1

## 2023-03-19 MED ORDER — MONTELUKAST SODIUM 10 MG PO TABS
10.0000 mg | ORAL_TABLET | Freq: Every day | ORAL | Status: DC
  Administered 2023-03-20: 10 mg via ORAL
  Filled 2023-03-19: qty 1

## 2023-03-19 MED ORDER — HEPARIN BOLUS VIA INFUSION
3000.0000 [IU] | Freq: Once | INTRAVENOUS | Status: AC
  Administered 2023-03-19: 3000 [IU] via INTRAVENOUS
  Filled 2023-03-19: qty 3000

## 2023-03-19 MED ORDER — ASPIRIN 81 MG PO TBEC: 81.0000 mg | DELAYED_RELEASE_TABLET | Freq: Every day | ORAL | Status: DC

## 2023-03-19 MED ORDER — VITAMIN D 25 MCG (1000 UNIT) PO TABS
3000.0000 [IU] | ORAL_TABLET | Freq: Every day | ORAL | Status: DC
  Administered 2023-03-20: 3000 [IU] via ORAL
  Filled 2023-03-19 (×2): qty 3

## 2023-03-19 MED ORDER — ASPIRIN 81 MG PO CHEW
81.0000 mg | CHEWABLE_TABLET | ORAL | Status: AC
  Administered 2023-03-20: 81 mg via ORAL
  Filled 2023-03-19: qty 1

## 2023-03-19 NOTE — H&P (Addendum)
Cardiology Admission History and Physical   Patient ID: Chelsea Gregory MRN: [id number]; DOB: 04/18/1946   Admission date: 03/19/2023  PCP:  Krystal Clark, NP   [facility] Providers Cardiologist:  Marlyn Corporal [name], MD        Chief Complaint:  chest pain  Patient Profile:   Chelsea Gregory is a 77 y.o. female with hx of GERD, DOE, OSA on CPAP, back pain and neck pain and recent + nuc study  who is being seen 03/19/2023 for the evaluation of chest pain.    History of Present Illness:   Chelsea Gregory with hx as above, was seen by DR. [name] in [place] office for DOE.  No prior cardiac hx.  DOes have hx of recurrent UTI and in Early August on Macrobid.  A myoview stress test was ordered and they were abnormal.  ASA was started along with atorvastatin 20 and she was seen in office with results of her test discussed and plan for cardiac cath.   Echo had been ordered done last week but no results that I can find..   (With nuc study her BP was elevated 202/88)  Today she noted her DOE and chest pain have increased so she was sent to ER at [facility].  BP there was 184/96  (on OV on the 16th of August 148/88). 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  Subacute appearing fracture of left anterior 4 and 5th ribs unchanged.    ER touched base with cardiology and plans made to transfer to [facility] for further eval and cardiac cath.  NTG paste applied but now removed. and ativan 0.5 mg given.    Has arrived here at [facility] still with some chest tightness no SOB now, that is more with exertion.      BP 137/76 P 101 R 18 and T 97.8  sp02 on RA 95%  Past Medical History:  Diagnosis Date   Arthritis    Knees    Chronic kidney disease    kidney infection post colon surgery on Macrodantin   Diverticular disease of left colon    GERD (gastroesophageal reflux disease)    Headache(784.0)    occasionally  migraines   MRSA colonization 2009   Neuromuscular disorder (HCC)    Cervical disc   Seasonal allergies    Sleep apnea    wear CPAP  tested > 3 years ago    Past Surgical History:  Procedure Laterality Date   ABDOMINAL HYSTERECTOMY  2006   ANTERIOR LAT LUMBAR FUSION Left 03/14/2016   Procedure: Extreme Lumbar Interbody Fusion Left Lumbar Two-Three,Lumbar Three-four,Lumbar Four-Five with Pedicle Screws;  Surgeon: Julio Sicks, MD;  Location: [facility];  Service: Neurosurgery;  Laterality: Left;  left approach   BACK SURGERY     CERVICAL DISCECTOMY  2010   CHOLECYSTECTOMY  2000   COLON SURGERY  2009   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, aso  had left ovary removed    Colosotmy  2009   COLOSTOMY CLOSURE  2010   EYE SURGERY     Implant to tear duct   HERNIA REPAIR  12/2009   Right  HERNIA REPAIR  2012   Left   JOINT REPLACEMENT Left 2015   LUMBAR LAMINECTOMY/DECOMPRESSION MICRODISCECTOMY  08/14/2011   Procedure: LUMBAR LAMINECTOMY/DECOMPRESSION MICRODISCECTOMY;  Surgeon: Temple Pacini, MD;  Location: [facility] OR[facility];  Service: Neurosurgery;  Laterality: Left;  Left Lumbar three-four, Bilateral Lumbar four-five decompressive lumbar laminectomy   LUMBAR PERCUTANEOUS PEDICLE SCREW 3 LEVEL Left 03/14/2016   Procedure: LUMBAR PERCUTANEOUS PEDICLE SCREW 3 LEVEL;  Surgeon: Julio Sicks, MD;  Location: [facility];  Service: Neurosurgery;  Laterality: Left;   TUBAL LIGATION  1968     Medications Prior to Admission: Prior to Admission medications   Medication Sig Start Date End Date Taking? Authorizing Provider  acetaminophen (TYLENOL 8 HOUR ARTHRITIS PAIN) 650 MG CR tablet Take 2,600 mg by mouth daily.    [provider]  alendronate (FOSAMAX) 70 MG tablet Take 70 mg by mouth every Monday. Take  with a full glass of water on an empty stomach.    [provider]  ALPRAZolam Prudy Feeler) 0.5 MG tablet Take 0.5-1 mg by mouth at bedtime as needed for anxiety.    [provider]  AMBULATORY NON FORMULARY MEDICATION every 30 (thirty) days. Allergy Shot    [provider]  AMBULATORY NON FORMULARY MEDICATION 3 tablets daily. [place]    [provider]  aspirin EC 81 MG tablet Take 1 tablet (81 mg total) by mouth daily. Swallow whole. 03/09/23   [name], Marlyn Corporal, MD  atorvastatin (LIPITOR) 20 MG tablet Take 1 tablet (20 mg total) by mouth daily. 03/09/23 06/07/23  [name], Marlyn Corporal, MD  butalbital-acetaminophen-caffeine (FIORICET) [phone] MG tablet Take 1 tablet by mouth as needed for headache.    [provider]  Cholecalciferol (VITAMIN D3) 3000 units TABS Take 3,000 Units by mouth daily.     [provider]  cholestyramine (QUESTRAN) 4 g packet MIX 1 PACKET WITH LIQUID AND  DRINK BY MOUTH DAILY IN THE  AFTERNOON. TAKE 2 HOURS BEFORE  OR AFTER ALL OTHER MEDICATIONS 02/23/23   Lynann Bologna, MD  cycloSPORINE (RESTASIS) 0.05 % ophthalmic emulsion Place 1 drop into both eyes 2 (two) times daily.    [provider]  estradiol (ESTRACE) 0.1 MG/GM vaginal cream Place 1 Applicatorful vaginally 3 (three) times a week.    [provider]  fexofenadine (ALLEGRA) 180 MG tablet Take 180 mg by mouth daily.    [provider]  fluticasone (FLONASE) 50 MCG/ACT nasal spray Place 2 sprays into both nostrils.    [provider]  loperamide (IMODIUM A-D) 2 MG tablet Take 1-3 tablets by mouth as directed.    [provider]  meloxicam (MOBIC) 7.5 MG tablet Take 7.5-15 mg by mouth daily. 09/29/15   [provider]  Misc Natural Products (OSTEO BI-FLEX TRIPLE STRENGTH PO) Take 1 tablet by mouth daily.    [provider]  montelukast (SINGULAIR) 10 MG tablet Take 10 mg by mouth daily.    [provider]  Multiple Vitamins-Minerals (MULTIVITAMINS THER. W/MINERALS) TABS Take 1 tablet by mouth daily.      [provider]     Allergies:    Allergies  Allergen Reactions   Sulfa Antibiotics Other (See Comments)    Hallucinations   Cortizone-10 [Hydrocortisone] Hives   Elemental Sulfur     Hallucinations    Celebrex [Celecoxib] Other (See Comments)    Increase in blood pressure.   Ciprofloxacin Rash   Levofloxacin Nausea And Vomiting   Metronidazole Nausea Only  Severe rash   Promethazine Hcl Other (See Comments)    Right muscle spasm.    Social History:   Social History   Socioeconomic History   Marital status: Married    Spouse name: Not on file   Number of children: 2   Years of education: Not on file   Highest education level: Not on file  Occupational History   Not on file  Tobacco Use   Smoking status: Never    Passive exposure: Never   Smokeless tobacco: Never  Vaping Use   Vaping status: Never Used  Substance and Sexual Activity   Alcohol use: No   Drug use: No   Sexual activity: Not on file  Other Topics Concern   Not on file  Social History Narrative   Not on file   Social Determinants of Health   Financial Resource Strain: Low Risk  (03/29/2022)   Received from [facility], [facility] visits prior to 09/23/2022., [facility] visits prior to 09/23/2022.   Overall Financial Resource Strain (CARDIA)    Difficulty of Paying Living Expenses: Not hard at all  Food Insecurity: No Food Insecurity (03/19/2023)   Hunger Vital Sign    Worried About Running Out of Food in the Last Year: Never true    Ran Out of Food in the Last Year: Never true  Transportation Needs: No Transportation Needs (03/19/2023)   PRAPARE - Administrator, Civil Service (Medical): No    Lack of Transportation (Non-Medical): No  Physical Activity: Unknown (03/29/2022)   [facility], [facility] visits prior to 09/23/2022., [facility] visits prior to 09/23/2022.   Exercise Vital Sign    Days of Exercise per Week: Patient declined    Minutes of Exercise per Session: Not on file  Stress: No Stress Concern Present (03/29/2022)   [facility] from [facility], [facility] visits prior to 09/23/2022., [facility] visits prior to 09/23/2022.   [employer] of Occupational Health - Occupational Stress Questionnaire    Feeling of Stress : Only a little  Social Connections: Socially Integrated (03/29/2022)   Received from [facility], [facility] visits prior to 09/23/2022., [facility] visits prior to 09/23/2022.   Social Connection and Isolation Panel [NHANES]    Frequency of Communication with Friends and Family: More than three times a week    Frequency of Social Gatherings with Friends and Family: Three times a week    Attends Religious Services: More than 4 times per year    Active Member of Clubs or Organizations: Yes    Attends Banker Meetings: More than 4 times per year    Marital Status: Married  Catering manager Violence: Not At Risk (03/19/2023)   Humiliation, Afraid, Rape, and Kick questionnaire    Fear of Current or Ex-Partner: No    Emotionally Abused: No    Physically Abused: No    Sexually Abused: No    Family History:   The patient's family history is negative for Anesthesia problems and Colon cancer.  No FH of CAD that pt is aware of  ROS:  Please see the history of present illness.  General:no colds or fevers, no weight changes Skin:no rashes or ulcers HEENT:no blurred vision, no congestion CV:see HPI PUL:see HPI GI:no diarrhea constipation or melena, no indigestion GU:no hematuria, no dysuria MS:no joint  pain, no claudication Neuro:no syncope, no lightheadedness Endo:no diabetes, no thyroid disease All other ROS reviewed and  negative.     Physical Exam/Data:   Vitals:   03/19/23 1832 03/19/23 1946  BP: 137/76 139/71  Pulse: (!) 101 92  Resp: 18 17  Temp: 97.8 F (36.6 C) 97.7 F (36.5 C)  TempSrc: Oral Oral  SpO2: 95% 94%  Weight: 64.9 kg   Height: 4\' 10"  (1.473 m)    No intake or output data in the 24 hours ending 03/19/23 1955    03/19/2023    6:32 PM 03/09/2023    2:07 PM 03/07/2023    7:41 AM  Last 3 Weights  Weight (lbs) 143 lb 1.3 oz 149 lb 3.2 oz 146 lb  Weight (kg) 64.9 kg 67.677 kg 66.225 kg     Body mass index is 29.9 kg/m.  General:  Well nourished, well developed, in no acute distress HEENT: normal Neck: no JVD Vascular: No carotid bruits; Distal pulses 2+ bilaterally   Cardiac:  normal S1, S2; RRR; no murmur gallup rub or click Lungs:  clear to auscultation bilaterally, no wheezing, rhonchi or rales  Abd: soft, nontender, no hepatomegaly  Ext: no edema Musculoskeletal:  No deformities, BUE and BLE strength normal and equal Skin: warm and dry  Neuro:  alert and Oriented X 3 MAE follows commands, no focal abnormalities noted Psych:  Normal affect    EKG:  The ECG that was done  03/19/23  was personally reviewed and demonstrates SR at 82 with T wave inversions in I, V 1 and V2 similar to old EKGs  Relevant CV Studies: Nuc study 03/07/23  Findings are consistent with ischemia. The study is intermediate risk.   LV perfusion is abnormal. Defect 1: There is a medium defect with moderate reduction in uptake present in the anterior location(s) that is reversible. There is normal wall motion in the defect area. Consistent with ischemia.   Left ventricular function is abnormal. Nuclear stress EF: 68%. The left ventricular ejection fraction is hyperdynamic (>65%). End diastolic cavity size is normal.   Prior study not available for comparison.  Laboratory Data:  High Sensitivity Troponin:  No results for input(s): "TROPONINIHS" in the last 720 hours.    ChemistryNo results for input(s):  "NA", "K", "CL", "CO2", "GLUCOSE", "BUN", "CREATININE", "CALCIUM", "MG", "GFRNONAA", "GFRAA", "ANIONGAP" in the last 168 hours.  No results for input(s): "PROT", "ALBUMIN", "AST", "ALT", "ALKPHOS", "BILITOT" in the last 168 hours. Lipids No results for input(s): "CHOL", "TRIG", "HDL", "LABVLDL", "LDLCALC", "CHOLHDL" in the last 168 hours. HematologyNo results for input(s): "WBC", "RBC", "HGB", "HCT", "MCV", "MCH", "MCHC", "RDW", "PLT" in the last 168 hours. Thyroid No results for input(s): "TSH", "FREET4" in the last 168 hours. BNPNo results for input(s): "BNP", "PROBNP" in the last 168 hours.  DDimer No results for input(s): "DDIMER" in the last 168 hours.   Radiology/Studies:  No results found.   Assessment and Plan:   Crescendo angina with abnormal stress test with plans for cardiac cath as outpt but were waiting for echo which has not been done yet.  Now with increasing DOE and some chest discomfort.   Troponin I 0.05 and 0.06, ddimer elevated but neg CTA of chest for PE.  Will add BB, continue ASA and statin.  Add IV heparin.  Add NTG paste if pain continues would consider changing to IV NTG. HTN may be stress related and currently improved her normal  122 systolic  add low dose BB HLD  on atorvastatin, started on the 16th.  Subacute fx ribs on CTA of chest.   Risk Assessment/Risk Scores:    TIMI Risk Score for Unstable Angina or Non-ST Elevation MI:   The patient's TIMI risk score is 4, which indicates a 20% risk of all cause mortality, new or recurrent myocardial infarction or need for urgent revascularization in the next 14 days.      Code Status: Full Code  Severity of Illness: The appropriate patient status for this patient is INPATIENT. Inpatient status is judged to be reasonable and necessary in order to provide the required intensity of service to ensure the patient's safety. The patient's presenting symptoms, physical exam findings, and initial radiographic and laboratory  data in the context of their chronic comorbidities is felt to place them at high risk for further clinical deterioration. Furthermore, it is not anticipated that the patient will be medically stable for discharge from the hospital within 2 midnights of admission.   * I certify that at the point of admission it is my clinical judgment that the patient will require inpatient hospital care spanning beyond 2 midnights from the point of admission due to high intensity of service, high risk for further deterioration and high frequency of surveillance required.*   For questions or updates, please contact [facility] Please consult [email or web address] for contact info under     Signed, Nada Boozer, NP  03/19/2023 7:55 PM   Patient seen and examined with Nada Boozer NP.  Agree as above, with the following exceptions and changes as noted below.  Patient seen today for worsening anginal symptoms of dyspnea on exertion and substernal chest discomfort with mildly elevated troponin and abnormal stress test positive for ischemia several days ago performed as an outpatient, anterior wall ischemia.  Referred to [facility] for cardiac catheterization.  Gen: NAD, CV: RRR, no murmurs, Lungs: clear, Abd: soft, Extrem: Warm bilateral calves, left lower extremity with 1+ edema, both calves tender to palpation.  Neuro/Psych: alert and oriented x 3, normal mood and affect. All available labs, radiology testing, previous records reviewed.   I have independently reviewed the images from her nuclear stress test, evidence of anterior wall ischemia.  Interestingly her blood pressure was very elevated (202/88) during her nuclear stress test, but this is unusual for her, as she normally has mildly elevated blood pressure.  Nevertheless nuclear stress test demonstrated ischemia.  She also had an echocardiogram but we are unable to review the results.  She was referred to the ER at [facility] due to significant dyspnea  upon presenting to her cardiologist office today.  D-dimer elevated, she had a CT PE study at [facility] which I independently reviewed the images.  Aberrant right subclavian artery.  No pulmonary embolism.  No evidence of aortic dissection.  No significant coronary artery calcifications.  No definite evidence of myocardial perfusion defects in the left ventricle.  Report suggests left anterior fourth and fifth rib fractures.  With ischemia in her anterior wall and cardiac chest pain, she warrants cardiac catheterization at this point.  For chest pain Nitropaste has worked very well earlier today we will reapply, start heparin infusion given continued chest pain and mild elevation in troponins.  Obtain echocardiogram report from [facility] from several days ago.  Her legs are tender and she has asymmetric swelling of the lower extremities left greater than right.  For completeness of workup with an elevated D-dimer, recommend lower extremity DVT studies.  She will be on heparin  therefore treated.  Parke Poisson, MD 03/19/23 8:14 PM

## 2023-03-19 NOTE — Progress Notes (Signed)
ANTICOAGULATION CONSULT NOTE - Initial Consult  Pharmacy Consult for heparin Indication: chest pain/ACS  Allergies  Allergen Reactions   Sulfa Antibiotics Other (See Comments)    Hallucinations   Cortizone-10 [Hydrocortisone] Hives   Elemental Sulfur     Hallucinations    Celebrex [Celecoxib] Other (See Comments)    Increase in blood pressure.   Ciprofloxacin Rash   Levofloxacin Nausea And Vomiting   Metronidazole Nausea Only    Severe rash   Promethazine Hcl Other (See Comments)    Right muscle spasm.    Patient Measurements: Height: 4\' 10"  (147.3 cm) Weight: 64.9 kg (143 lb 1.3 oz) IBW/kg (Calculated) : 40.9 Heparin Dosing Weight: 55 kg  Vital Signs: Temp: 97.7 F (36.5 C) (08/26 1946) Temp Source: Oral (08/26 1946) BP: 139/71 (08/26 1946) Pulse Rate: 92 (08/26 1946)  Labs: Recent Labs    03/19/23 1937  TROPONINIHS 41*    CrCl cannot be calculated (Patient's most recent lab result is older than the maximum 21 days allowed.).   Medical History: Past Medical History:  Diagnosis Date   Arthritis    Knees   Chronic kidney disease    kidney infection post colon surgery on Macrodantin   Diverticular disease of left colon    GERD (gastroesophageal reflux disease)    Headache(784.0)    occasionally  migraines   MRSA colonization 2009   Neuromuscular disorder (HCC)    Cervical disc   Seasonal allergies    Sleep apnea    wear CPAP  tested > 3 years ago     Assessment: 77 yo W admitted for crescendo angina with abnormal stress test and hypertensive emergency. She planning to have an outpatient cath but pending Echo. No anticoagulation prior to admission. Pharmacy consulted for heparin.   Planning cath inpatient.     Goal of Therapy:  Heparin level 0.3-0.7 units/ml Monitor platelets by anticoagulation protocol: Yes   Plan:  Heparin 3000 units x1 then 800 units/hr Monitor daily heparin level, CBC, signs/symptoms of bleeding  F/u cath   Alphia Moh,  PharmD, BCPS, BCCP Clinical Pharmacist  Please check AMION for all [facility] phone numbers After 10:00 PM, call Main Pharmacy [phone]

## 2023-03-19 NOTE — Progress Notes (Addendum)
   Nurse Visit   Date of Encounter: 03/19/2023 ID: Nashay, Cart 1946/07/14, MRN [id number]  PCP:  Krystal Clark, NP   [facility] Providers Cardiologist:  None      Visit Details   VS:  There were no vitals taken for this visit. , BMI There is no height or weight on file to calculate BMI.  Wt Readings from Last 3 Encounters:  03/09/23 149 lb 3.2 oz (67.7 kg)  03/07/23 146 lb (66.2 kg)  03/02/23 146 lb 6.4 oz (66.4 kg)     Reason for visit: Speak to Dr. Vincent Gros regarding chest tightness and SOB Performed today: Education and Provider consulted Changes (medications, testing, etc.) : No new orders Length of Visit: 20 minutes    Medications Adjustments/Labs and Tests Ordered: No orders of the defined types were placed in this encounter.  No orders of the defined types were placed in this encounter.    Signed, Samson Frederic, RN  03/19/2023 11:28 AM   With progressive dyspnea on exertion patient presented to the office accompanied by her husband. With interval change in symptoms and given her overall risk for cardiovascular disease, highly suspicious for an acute coronary event. Recommended she be urgently evaluated at the emergency room, as she was hesitant to go to the ER, I met briefly with patient and her husband and encouraged them to go to the ER right away and suggested EMS. Husband wanted to take her by himself. Notified Dr. Jeanmarie Hubert at [facility] ER.

## 2023-03-20 ENCOUNTER — Observation Stay (HOSPITAL_BASED_OUTPATIENT_CLINIC_OR_DEPARTMENT_OTHER): Payer: Medicare Other

## 2023-03-20 ENCOUNTER — Encounter (HOSPITAL_COMMUNITY)
Admission: RE | Disposition: A | Discharge status: Home / Self Care | Payer: Self-pay | Source: Other Acute Inpatient Hospital | Attending: Cardiology

## 2023-03-20 DIAGNOSIS — R0602 Shortness of breath: Secondary | ICD-10-CM

## 2023-03-20 DIAGNOSIS — I209 Angina pectoris, unspecified: Secondary | ICD-10-CM

## 2023-03-20 DIAGNOSIS — R9439 Abnormal result of other cardiovascular function study: Secondary | ICD-10-CM

## 2023-03-20 DIAGNOSIS — Z79899 Other long term (current) drug therapy: Secondary | ICD-10-CM | POA: Diagnosis not present

## 2023-03-20 DIAGNOSIS — I501 Left ventricular failure: Secondary | ICD-10-CM

## 2023-03-20 DIAGNOSIS — I200 Unstable angina: Secondary | ICD-10-CM | POA: Diagnosis not present

## 2023-03-20 DIAGNOSIS — M79661 Pain in right lower leg: Secondary | ICD-10-CM | POA: Diagnosis not present

## 2023-03-20 DIAGNOSIS — N189 Chronic kidney disease, unspecified: Secondary | ICD-10-CM | POA: Diagnosis not present

## 2023-03-20 DIAGNOSIS — I5033 Acute on chronic diastolic (congestive) heart failure: Secondary | ICD-10-CM

## 2023-03-20 HISTORY — DX: Acute on chronic diastolic (congestive) heart failure: I50.33

## 2023-03-20 HISTORY — PX: LEFT HEART CATH AND CORONARY ANGIOGRAPHY: CATH118249

## 2023-03-20 LAB — CBC
HCT: 37.5 % (ref 36.0–46.0)
Hemoglobin: 12.6 g/dL (ref 12.0–15.0)
MCH: 31.6 pg (ref 26.0–34.0)
MCHC: 33.6 g/dL (ref 30.0–36.0)
MCV: 94 fL (ref 80.0–100.0)
Platelets: 269 10*3/uL (ref 150–400)
RBC: 3.99 MIL/uL (ref 3.87–5.11)
RDW: 12.6 % (ref 11.5–15.5)
WBC: 8.6 10*3/uL (ref 4.0–10.5)
nRBC: 0 % (ref 0.0–0.2)

## 2023-03-20 LAB — LIPID PANEL
Cholesterol: 126 mg/dL (ref 0–200)
HDL: 63 mg/dL (ref >40)
LDL Cholesterol: 42 mg/dL (ref 0–99)
Total CHOL/HDL Ratio: 2 ratio
Triglycerides: 105 mg/dL (ref <150)
VLDL: 21 mg/dL (ref 0–40)

## 2023-03-20 LAB — ECHOCARDIOGRAM LIMITED
Area-P 1/2: 4.15 cm2
Calc EF: 57.7 %
Height: 58 in
S' Lateral: 2.1 cm
Single Plane A2C EF: 54.7 %
Single Plane A4C EF: 60.1 %
Weight: 2331.58 oz

## 2023-03-20 LAB — BASIC METABOLIC PANEL
Anion gap: 8 (ref 5–15)
BUN: 13 mg/dL (ref 8–23)
CO2: 21 mmol/L — ABNORMAL LOW (ref 22–32)
Calcium: 8.5 mg/dL — ABNORMAL LOW (ref 8.9–10.3)
Chloride: 108 mmol/L (ref 98–111)
Creatinine, Ser: 0.92 mg/dL (ref 0.44–1.00)
GFR, Estimated: >60 mL/min (ref >60)
Glucose, Bld: 94 mg/dL (ref 70–99)
Potassium: 3.7 mmol/L (ref 3.5–5.1)
Sodium: 137 mmol/L (ref 135–145)

## 2023-03-20 LAB — HEPARIN LEVEL (UNFRACTIONATED): Heparin Unfractionated: 0.66 [IU]/mL (ref 0.30–0.70)

## 2023-03-20 SURGERY — LEFT HEART CATH AND CORONARY ANGIOGRAPHY
Anesthesia: LOCAL

## 2023-03-20 MED ORDER — MIDAZOLAM HCL 2 MG/2ML IJ SOLN
INTRAMUSCULAR | Status: DC | PRN
  Administered 2023-03-20: 2 mg via INTRAVENOUS

## 2023-03-20 MED ORDER — FUROSEMIDE 20 MG PO TABS: 20.0000 mg | ORAL_TABLET | ORAL | 0 refills | Status: DC | PRN

## 2023-03-20 MED ORDER — HYDRALAZINE HCL 20 MG/ML IJ SOLN: 10.0000 mg | INTRAMUSCULAR | Status: AC | PRN

## 2023-03-20 MED ORDER — FENTANYL CITRATE (PF) 100 MCG/2ML IJ SOLN
INTRAMUSCULAR | Status: DC | PRN
  Administered 2023-03-20: 25 ug via INTRAVENOUS

## 2023-03-20 MED ORDER — LABETALOL HCL 5 MG/ML IV SOLN: 10.0000 mg | INTRAVENOUS | Status: AC | PRN

## 2023-03-20 MED ORDER — ONDANSETRON HCL 4 MG/2ML IJ SOLN: 4.0000 mg | Freq: Four times a day (QID) | INTRAMUSCULAR | Status: DC | PRN

## 2023-03-20 MED ORDER — SODIUM CHLORIDE 0.9 % IV SOLN: INTRAVENOUS | Status: AC

## 2023-03-20 MED ORDER — SODIUM CHLORIDE 0.9% FLUSH
3.0000 mL | Freq: Two times a day (BID) | INTRAVENOUS | Status: DC
  Administered 2023-03-20: 3 mL via INTRAVENOUS

## 2023-03-20 MED ORDER — VERAPAMIL HCL 2.5 MG/ML IV SOLN
INTRAVENOUS | Status: AC
  Filled 2023-03-20: qty 2

## 2023-03-20 MED ORDER — FUROSEMIDE 10 MG/ML IJ SOLN
20.0000 mg | Freq: Once | INTRAMUSCULAR | Status: AC
  Administered 2023-03-20: 20 mg via INTRAVENOUS
  Filled 2023-03-20: qty 2

## 2023-03-20 MED ORDER — IOHEXOL 350 MG/ML SOLN
INTRAVENOUS | Status: DC | PRN
  Administered 2023-03-20: 47 mL

## 2023-03-20 MED ORDER — SODIUM CHLORIDE 0.9% FLUSH: 3.0000 mL | INTRAVENOUS | Status: DC | PRN

## 2023-03-20 MED ORDER — MIDAZOLAM HCL 2 MG/2ML IJ SOLN
INTRAMUSCULAR | Status: AC
  Filled 2023-03-20: qty 2

## 2023-03-20 MED ORDER — SODIUM CHLORIDE 0.9 % IV SOLN: 250.0000 mL | INTRAVENOUS | Status: DC | PRN

## 2023-03-20 MED ORDER — METOPROLOL TARTRATE 25 MG PO TABS: 12.5000 mg | ORAL_TABLET | Freq: Two times a day (BID) | ORAL | 5 refills | Status: DC

## 2023-03-20 MED ORDER — HEPARIN (PORCINE) IN NACL 1000-0.9 UT/500ML-% IV SOLN
INTRAVENOUS | Status: DC | PRN
  Administered 2023-03-20 (×2): 500 mL

## 2023-03-20 MED ORDER — LIDOCAINE HCL (PF) 1 % IJ SOLN
INTRAMUSCULAR | Status: DC | PRN
  Administered 2023-03-20: 2 mL

## 2023-03-20 MED ORDER — VERAPAMIL HCL 2.5 MG/ML IV SOLN
INTRAVENOUS | Status: DC | PRN
  Administered 2023-03-20 (×2): 10 mL via INTRA_ARTERIAL

## 2023-03-20 MED ORDER — LIDOCAINE HCL (PF) 1 % IJ SOLN
INTRAMUSCULAR | Status: AC
  Filled 2023-03-20: qty 30

## 2023-03-20 MED ORDER — ACETAMINOPHEN 325 MG PO TABS: 650.0000 mg | ORAL_TABLET | ORAL | Status: DC | PRN

## 2023-03-20 MED ORDER — FENTANYL CITRATE (PF) 100 MCG/2ML IJ SOLN
INTRAMUSCULAR | Status: AC
  Filled 2023-03-20: qty 2

## 2023-03-20 MED ORDER — HEPARIN SODIUM (PORCINE) 1000 UNIT/ML IJ SOLN
INTRAMUSCULAR | Status: DC | PRN
  Administered 2023-03-20: 3000 [IU] via INTRAVENOUS

## 2023-03-20 MED ORDER — HEPARIN SODIUM (PORCINE) 1000 UNIT/ML IJ SOLN
INTRAMUSCULAR | Status: AC
  Filled 2023-03-20: qty 10

## 2023-03-20 SURGICAL SUPPLY — 9 items
CATH INFINITI 5FR MULTPACK ANG (CATHETERS) IMPLANT
DEVICE RAD TR BAND REGULAR (VASCULAR PRODUCTS) IMPLANT
GLIDESHEATH SLEND SS 6F .021 (SHEATH) IMPLANT
GUIDEWIRE INQWIRE 1.5J.035X260 (WIRE) IMPLANT
INQWIRE 1.5J .035X260CM (WIRE) ×1
PACK CARDIAC CATHETERIZATION (CUSTOM PROCEDURE TRAY) ×1 IMPLANT
SET ATX-X65L (MISCELLANEOUS) IMPLANT
SHEATH PROBE COVER 6X72 (BAG) IMPLANT
WIRE HI TORQ VERSACORE-J 145CM (WIRE) IMPLANT

## 2023-03-20 NOTE — Care Management Obs Status (Signed)
MEDICARE OBSERVATION STATUS NOTIFICATION   Patient Details  Name: Chelsea Gregory MRN: [id number] Date of Birth: Jan 04, 1946   Medicare Observation Status Notification Given:  Yes    Leone Haven, RN 03/20/2023, 2:22 PM

## 2023-03-20 NOTE — Interval H&P Note (Signed)
Cath Lab Visit (complete for each Cath Lab visit)  Clinical Evaluation Leading to the Procedure:   ACS: Yes.    Non-ACS:    Anginal Classification: CCS IV  Anti-ischemic medical therapy: Minimal Therapy (1 class of medications)  Non-Invasive Test Results: Intermediate-risk stress test findings: cardiac mortality 1-3%/year  Prior CABG: No previous CABG      History and Physical Interval Note:  03/20/2023 8:39 AM  Chelsea Gregory  has presented today for surgery, with the diagnosis of unstable angina.  The various methods of treatment have been discussed with the patient and family. After consideration of risks, benefits and other options for treatment, the patient has consented to  Procedure(s): LEFT HEART CATH AND CORONARY ANGIOGRAPHY (N/A) as a surgical intervention.  The patient's history has been reviewed, patient examined, no change in status, stable for surgery.  I have reviewed the patient's chart and labs.  Questions were answered to the patient's satisfaction.     Lance Muss

## 2023-03-20 NOTE — Progress Notes (Signed)
  Echocardiogram 2D Echocardiogram has been performed.  Chelsea Gregory 03/20/2023, 3:49 PM

## 2023-03-20 NOTE — Plan of Care (Signed)
Chelsea Gregory is a 77 y.o. female with hx of GERD, DOE, OSA on CPAP, back pain and neck pain and recent + nuc study  who is being seen 03/19/2023 from [place] for DOE. She was HDS. BP elevated and trop 0.05 and 0.06 at [facility]. Cath today showed normal CORS and LVEDP was nl. However BNP 1140 and she reported dyspnea with climbing stairs. Will plan for 1x IV diuresis and PO PRN for home. Blood pressure has improved. On BB can continue this. She was planned for TTE and not unreasonable to assess for any valvular disease; did not appreciate on exam. Otherwise, can discharge today. See dc summary  Vitals:   03/20/23 0919 03/20/23 0959  BP: 111/63 (!) 105/55  Pulse: (!) 0 83  Resp: (!) 23   Temp:    SpO2:     Physical Exam Gen: well appearing Neuro: alert and oriented CV: r,r,r no murmurs. No JVD Vasc: 2+ radial pulses; TR band on Pulm: nl wob, CLAB Abd: non distended Ext: No LE edema Skin: warm and well perfused Psych: normal mood

## 2023-03-20 NOTE — TOC Transition Note (Signed)
Transition of Care [facility]) - CM/SW Discharge Note   Patient Details  Name: Chelsea Gregory MRN: [id number] Date of Birth: Dec 29, 1945  Transition of Care [facility]) CM/SW Contact:  Leone Haven, RN Phone Number: 03/20/2023, 10:22 AM   Clinical Narrative:    Patient is for possible dc today, spouse will transport her home. Has no needs.   Final next level of care: Home/Self Care Barriers to Discharge: No Barriers Identified   Patient Goals and CMS Choice   Choice offered to / list presented to : NA  Discharge Placement                         Discharge Plan and Services Additional resources added to the After Visit Summary for   In-house Referral: NA Discharge Planning Services: CM Consult Post Acute Care Choice: NA            DME Agency: NA       HH Arranged: NA          Social Determinants of Health (SDOH) Interventions SDOH Screenings   Food Insecurity: No Food Insecurity (03/19/2023)  Housing: Patient Declined (03/19/2023)  Transportation Needs: No Transportation Needs (03/19/2023)  Utilities: Not At Risk (03/19/2023)  Financial Resource Strain: Low Risk  (03/29/2022)   [facility] prior to 09/23/2022., [facility] visits prior to 09/23/2022.  Physical Activity: Unknown (03/29/2022)   [facility], [facility] prior to 09/23/2022., [facility] visits prior to 09/23/2022.  Social Connections: Socially Integrated (03/29/2022)   [facility], [facility] prior to 09/23/2022., [facility] visits prior to 09/23/2022.  Stress: No Stress Concern Present (03/29/2022)   [facility], [facility] visits prior to 09/23/2022., [facility] visits prior to 09/23/2022.  Tobacco Use: Low Risk  (03/12/2023)   [facility]     Readmission Risk Interventions     No data to display

## 2023-03-20 NOTE — Care Management CC44 (Signed)
Condition Code 44 Documentation Completed  Patient Details  Name: Chelsea Gregory MRN: [id number] Date of Birth: 03/24/1946   Condition Code 44 given:  Yes Patient signature on Condition Code 44 notice:  Yes Documentation of 2 MD's agreement:  Yes Code 44 added to claim:  Yes    Leone Haven, RN 03/20/2023, 2:22 PM

## 2023-03-20 NOTE — Plan of Care (Signed)
  Problem: Health Behavior/Discharge Planning: Goal: Ability to manage health-related needs will improve Outcome: Progressing   Problem: Clinical Measurements: Goal: Ability to maintain clinical measurements within normal limits will improve Outcome: Progressing Goal: Will remain free from infection Outcome: Progressing   

## 2023-03-20 NOTE — Plan of Care (Signed)
  Problem: Education: Goal: Knowledge of General Education information will improve Description: Including pain rating scale, medication(s)/side effects and non-pharmacologic comfort measures Outcome: Progressing   Problem: Health Behavior/Discharge Planning: Goal: Ability to manage health-related needs will improve Outcome: Progressing   Problem: Clinical Measurements: Goal: Ability to maintain clinical measurements within normal limits will improve Outcome: Progressing Goal: Will remain free from infection Outcome: Progressing Goal: Diagnostic test results will improve Outcome: Progressing Goal: Respiratory complications will improve Outcome: Progressing Goal: Cardiovascular complication will be avoided Outcome: Progressing   Problem: Activity: Goal: Risk for activity intolerance will decrease Outcome: Progressing   Problem: Nutrition: Goal: Adequate nutrition will be maintained Outcome: Progressing   Problem: Coping: Goal: Level of anxiety will decrease Outcome: Progressing   Problem: Elimination: Goal: Will not experience complications related to bowel motility Outcome: Progressing Goal: Will not experience complications related to urinary retention Outcome: Progressing   Problem: Pain Managment: Goal: General experience of comfort will improve Outcome: Progressing   Problem: Safety: Goal: Ability to remain free from injury will improve Outcome: Progressing   Problem: Skin Integrity: Goal: Risk for impaired skin integrity will decrease Outcome: Progressing   Problem: Education: Goal: Understanding of CV disease, CV risk reduction, and recovery process will improve Outcome: Progressing   Problem: Activity: Goal: Ability to return to baseline activity level will improve Outcome: Progressing   Problem: Cardiovascular: Goal: Ability to achieve and maintain adequate cardiovascular perfusion will improve Outcome: Progressing

## 2023-03-20 NOTE — TOC Initial Note (Signed)
Transition of Care [facility]) - Initial/Assessment Note    Patient Details  Name: Chelsea Gregory MRN: [id number] Date of Birth: 01/05/1946  Transition of Care [facility]) CM/SW Contact:    Leone Haven, RN Phone Number: 03/20/2023, 10:21 AM  Clinical Narrative:                 From home with spouse, has PCP and insurance on file, states has no HH services in place at this time or DME at home.  States spouse will transport her home at [employer] and family is support system, states gets medications from [facility] in [place].  Pta self ambulatory.  Expected Discharge Plan: Home/Self Care Barriers to Discharge: No Barriers Identified   Patient Goals and CMS Choice Patient states their goals for this hospitalization and ongoing recovery are:: return home with spouse   Choice offered to / list presented to : NA      Expected Discharge Plan and Services In-house Referral: NA Discharge Planning Services: CM Consult Post Acute Care Choice: NA Living arrangements for the past 2 months: Single Family Home                   DME Agency: NA       HH Arranged: NA          Prior Living Arrangements/Services Living arrangements for the past 2 months: Single Family Home Lives with:: Spouse Patient language and need for interpreter reviewed:: Yes Do you feel safe going back to the place where you live?: Yes      Need for Family Participation in Patient Care: Yes (Comment) Care giver support system in place?: Yes (comment)   Criminal Activity/Legal Involvement Pertinent to Current Situation/Hospitalization: No - Comment as needed  Activities of Daily Living Home Assistive Devices/Equipment: Shower chair with back, Walker (specify type) ADL Screening (condition at time of admission) Patient's cognitive ability adequate to safely complete daily activities?: Yes Is the patient deaf or have difficulty hearing?: No Does the patient have difficulty seeing, even when wearing  glasses/contacts?: No Does the patient have difficulty concentrating, remembering, or making decisions?: No Patient able to express need for assistance with ADLs?: No Does the patient have difficulty dressing or bathing?: No Independently performs ADLs?: Yes (appropriate for developmental age) Does the patient have difficulty walking or climbing stairs?: Yes Weakness of Legs: Both Weakness of Arms/Hands: None  Permission Sought/Granted Permission sought to share information with : Case Manager Permission granted to share information with : Yes, Verbal Permission Granted              Emotional Assessment   Attitude/Demeanor/Rapport: Engaged Affect (typically observed): Appropriate Orientation: : Oriented to Self, Oriented to Place, Oriented to  Time, Oriented to Situation Alcohol / Substance Use: Not Applicable Psych Involvement: No (comment)  Admission diagnosis:  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] Patient Active Problem List   Diagnosis Date Noted   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   Abnormal cardiovascular stress test 03/09/2023   Dyspnea on exertion over the past month and a half 03/02/2023   Fatigue 03/02/2023   Lumbar foraminal stenosis 03/14/2016   Abdominal pain of unknown cause 10/21/2015   Gastro-esophageal reflux disease without esophagitis 10/08/2015   Essential (primary) hypertension 10/08/2015   Malaise and fatigue 10/08/2015   Asthma, mild intermittent 10/08/2015   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   Generalized OA 10/08/2015   Abdominal pain, right lower quadrant 04/01/2014   Acute infection of nasal sinus 04/01/2014   Anxiety  04/01/2014   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   Chronic rhinitis 04/01/2014   Degeneration of intervertebral disc of lumbosacral region 04/01/2014   Diaphragmatic hernia 04/01/2014   DD (diverticular disease) 04/01/2014   Gonalgia 04/01/2014   Headache, migraine 04/01/2014   Muscle ache 04/01/2014    Arthritis, degenerative 04/01/2014   Osteopenia 04/01/2014   Apnea, sleep 04/01/2014   Avitaminosis D 04/01/2014   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 [place] - [address] [place] [address] Phone: [phone] Fax: [phone]  [facility] [address], [place] - [address] [place] [address] Phone: [phone] Fax: [phone]  [facility]) - [place], [place] - [address] [facility] [address] [place] [address] Phone: [phone] Fax: [phone]  [facility] Delivery - [place], [place] - [address] [place] [address] Phone: [phone] Fax: [phone]  [facility] - [place], [place] - [address] [place] [address] Phone: [phone] Fax: [phone]     Social Determinants of Health (SDOH) Social History: SDOH Screenings   Food Insecurity: No Food Insecurity (03/19/2023)  Housing: Patient Declined (03/19/2023)  Transportation Needs: No Transportation Needs (03/19/2023)  Utilities: Not At Risk (03/19/2023)  Financial Resource Strain: Low Risk  (03/29/2022)   [facility] prior to 09/23/2022., [facility] visits prior to 09/23/2022.  Physical Activity: Unknown (03/29/2022)   [facility], [facility] prior to 09/23/2022., [facility] visits prior to 09/23/2022.  Social Connections: Socially  Integrated (03/29/2022)   [facility] visits prior to 09/23/2022., [facility] visits prior to 09/23/2022.  Stress: No Stress Concern Present (03/29/2022)   [facility], [facility] visits prior to 09/23/2022., [facility] visits prior to 09/23/2022.  Tobacco Use: Low Risk  (03/12/2023)   [facility]   SDOH Interventions:     Readmission Risk Interventions     No data to display

## 2023-03-20 NOTE — Discharge Summary (Signed)
Discharge Summary    Patient ID: Chelsea Gregory MRN: [id number]; DOB: 1946/06/09  Admit date: 03/19/2023 Discharge date: 03/20/2023  PCP:  Chelsea Clark, NP   [facility] Providers Cardiologist:  Chelsea Corporal [name], MD        Discharge Diagnoses    Principal Problem:   [place] [facility]) Active Problems:   Acute on chronic diastolic (congestive) heart failure [facility])    Diagnostic Studies/Procedures    Cath 03/20/2023   The left ventricular systolic function is normal.   LV end diastolic pressure is normal.   The left ventricular ejection fraction is 55-65% by visual estimate.   There is no aortic valve stenosis.   In the absence of any other complications or medical issues, we expect the patient to be ready for discharge from a cath perspective on 03/20/2023.   No angiographically apparent coronary artery disease.    Echo 03/20/2023  1. Left ventricular ejection fraction, by estimation, is 55 to 60%. Left  ventricular ejection fraction by 3D volume is 55 %. The left ventricle has  normal function. The left ventricle demonstrates regional wall motion  abnormalities (see scoring  diagram/findings for description). There is moderate asymmetric left  ventricular hypertrophy of the basal-septal segment. Indeterminate  diastolic filling due to E-A fusion.   2. Right ventricular systolic function is hyperdynamic. The right  ventricular size is normal.   3. The mitral valve is grossly normal. Trivial mitral valve  regurgitation. No evidence of mitral stenosis.   4. The aortic valve is tricuspid. Aortic valve regurgitation is not  visualized. No aortic stenosis is present.   5. The inferior vena cava is normal in size with greater than 50%  respiratory variability, suggesting right atrial pressure of 3 mmHg.   6. Evidence of atrial level shunting detected by color flow Doppler.   Comparison(s): Prior images unable to be directly viewed,  comparison made  by report only. No prior Echocardiogram.    LE venous doppler 03/20/2023 Summary:  RIGHT:   - There is no evidence of deep vein thrombosis in the lower extremity.    - No cystic structure found in the popliteal fossa.    LEFT:   - There is no evidence of deep vein thrombosis in the lower extremity.    - No cystic structure found in the popliteal fossa.    *See table(s) above for measurements and observations.  _____________   History of Present Illness     Chelsea Gregory is a 77 y.o. female with hx of GERD, DOE, OSA on CPAP, back pain and neck pain and recent + nuc study  who is being seen 03/19/2023 for the evaluation of chest pain.   Ms. Chelsea Gregory with hx as above, was seen by DR. [name] in [place] office for DOE.  No prior cardiac hx.  DOes have hx of recurrent UTI and in Early August on Macrobid.  A myoview stress test was ordered and they were abnormal.  ASA was started along with atorvastatin 20 and she was seen in office with results of her test discussed and plan for cardiac cath.   Echo had been ordered done last week but no results that I can find..   (With nuc study her BP was elevated 202/88)   Today she noted her DOE and chest pain have increased so she was sent to ER at [facility].  BP there was 184/96  (on OV on the 16th of August 148/88). 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  Subacute appearing fracture of left anterior 4 and 5th ribs unchanged.     ER touched base with cardiology and plans made to transfer to [facility] for further eval and cardiac cath.  NTG paste applied but now removed. and ativan 0.5 mg given.     Has arrived here at [facility] still with some chest tightness no SOB now, that  is more with exertion.       BP 137/76 P 101 R 18 and T 97.8  sp02 on RA 95%    Hospital Course     Consultants: N/A   Patient was transferred to [facility] for cardiac catheterization given recurrent chest pain.  Cardiac catheterization performed on 03/20/2023 demonstrated no angiographically apparent CAD.  EF 55 to 65%.  Subsequent limited echocardiogram obtained 03/20/2023 demonstrated EF 55 to 60%, moderate asymmetric LV hypertrophy of basal septal segment, hyperdynamic RV, trivial MR.  Although echocardiogram does show wall motion abnormality in the mid anterior segment, however given normal coronary arteries, no further workup was recommended.  Venous Doppler negative for DVT.  Patient is deemed stable for discharge from cardiac perspective.  She will given a single 20 mg IV Lasix after cath for diuresis.  We will send the patient home on as needed dose of 20 mg Lasix.  Staff message sent to schedule follow-up.  Dr. Wyline Mood recommended continue on the metoprolol 12.5 mg twice a day that was started during this admission.      Did the patient have an acute coronary syndrome (MI, NSTEMI, STEMI, etc) this admission?:  No                               Did the patient have a percutaneous coronary intervention (stent / angioplasty)?:  No.          _____________  Discharge Vitals Blood pressure 129/76, pulse 93, temperature 97.6 F (36.4 C), temperature source Oral, resp. rate 18, height 4\' 10"  (1.473 m), weight 66.1 kg, SpO2 96%.  Filed Weights   03/19/23 1832 03/20/23 0401  Weight: 64.9 kg 66.1 kg    Labs & Radiologic Studies    CBC Recent Labs    03/20/23 0635  WBC 8.6  HGB 12.6  HCT 37.5  MCV 94.0  PLT 269   Basic Metabolic Panel Recent Labs    08/65/78 1937 03/20/23 0635  NA  --  137  K  --  3.7  CL  --  108  CO2  --  21*  GLUCOSE  --  94  BUN  --  13  CREATININE  --  0.92  CALCIUM  --  8.5*  MG 2.0  --    Liver Function Tests No results for  input(s): "AST", "ALT", "ALKPHOS", "BILITOT", "PROT", "ALBUMIN" in the last 72 hours. No results for input(s): "LIPASE", "AMYLASE" in the last 72 hours. High Sensitivity Troponin:   Recent Labs  Lab 03/19/23 1937 03/19/23 2129  TROPONINIHS 41* 33*    BNP Invalid input(s): "POCBNP" D-Dimer No results for input(s): "DDIMER" in the last 72 hours. Hemoglobin  A1C Recent Labs    03/19/23 1937  HGBA1C 5.1   Fasting Lipid Panel Recent Labs    03/20/23 0635  CHOL 126  HDL 63  LDLCALC 42  TRIG 105  CHOLHDL 2.0   Thyroid Function Tests Recent Labs    03/19/23 1937  TSH 3.883   _____________  VAS [place] LOWER EXTREMITY VENOUS (DVT)  Result Date: 03/20/2023  Lower Venous DVT Study Patient Name:  Chelsea Gregory  Date of Exam:   03/20/2023 Medical Rec #: [id number]          Accession #:    [id number] Date of Birth: [birth year]          Patient Gender: F Patient Age:   66 years Exam Location[facility]  [facility] Procedure:      VAS [place] LOWER EXTREMITY VENOUS (DVT) Referring Phys: Vernona Rieger INGOLD --------------------------------------------------------------------------------  Indications: Elevated d-dimer, calf tenderness.  Performing Technologist: Jean Rosenthal RDMS, RVT  Examination Guidelines: A complete evaluation includes B-mode imaging, spectral Doppler, color Doppler, and power Doppler as needed of all accessible portions of each vessel. Bilateral testing is considered an integral part of a complete examination. Limited examinations for reoccurring indications may be performed as noted. The reflux portion of the exam is performed with the patient in reverse Trendelenburg.  +---------+---------------+---------+-----------+----------+--------------+ RIGHT    CompressibilityPhasicitySpontaneityPropertiesThrombus Aging +---------+---------------+---------+-----------+----------+--------------+ CFV      Full           Yes      Yes                                  +---------+---------------+---------+-----------+----------+--------------+ SFJ      Full                                                        +---------+---------------+---------+-----------+----------+--------------+ FV Prox  Full                                                        +---------+---------------+---------+-----------+----------+--------------+ FV Mid   Full                                                        +---------+---------------+---------+-----------+----------+--------------+ FV DistalFull                                                        +---------+---------------+---------+-----------+----------+--------------+ PFV      Full                                                        +---------+---------------+---------+-----------+----------+--------------+ POP  Full           Yes      Yes                                 +---------+---------------+---------+-----------+----------+--------------+ PTV      Full                                                        +---------+---------------+---------+-----------+----------+--------------+ PERO     Full                                                        +---------+---------------+---------+-----------+----------+--------------+   +---------+---------------+---------+-----------+----------+--------------+ LEFT     CompressibilityPhasicitySpontaneityPropertiesThrombus Aging +---------+---------------+---------+-----------+----------+--------------+ CFV      Full           Yes      Yes                                 +---------+---------------+---------+-----------+----------+--------------+ SFJ      Full                                                        +---------+---------------+---------+-----------+----------+--------------+ FV Prox  Full                                                         +---------+---------------+---------+-----------+----------+--------------+ FV Mid   Full                                                        +---------+---------------+---------+-----------+----------+--------------+ FV DistalFull                                                        +---------+---------------+---------+-----------+----------+--------------+ PFV      Full                                                        +---------+---------------+---------+-----------+----------+--------------+ POP      Full           Yes      Yes                                 +---------+---------------+---------+-----------+----------+--------------+  PTV      Full                                                        +---------+---------------+---------+-----------+----------+--------------+ PERO     Full                                                        +---------+---------------+---------+-----------+----------+--------------+     Summary: RIGHT: - There is no evidence of deep vein thrombosis in the lower extremity.  - No cystic structure found in the popliteal fossa.  LEFT: - There is no evidence of deep vein thrombosis in the lower extremity.  - No cystic structure found in the popliteal fossa.  *See table(s) above for measurements and observations.    Preliminary    ECHOCARDIOGRAM LIMITED  Result Date: 03/20/2023    ECHOCARDIOGRAM LIMITED REPORT   Patient Name:   Chelsea Gregory Date of Exam: 03/20/2023 Medical Rec #:  [id number]         Height:       58.0 in Accession #:    [id number]        Weight:       145.7 lb Date of Birth:  1946-03-08         BSA:          1.592 m Patient Age:    53 years          BP:           128/77 mmHg Patient Gender: F                 HR:           76 bpm. Exam Location:  Inpatient Procedure: Limited Echo, Cardiac Doppler, Color Doppler and 3D Echo Indications:    R06.02 SOB. Limited echo for LV/RV function and valve gradients.   History:        Patient has no prior history of Echocardiogram examinations.                 CHF, Signs/Symptoms:Murmur, Dyspnea and Shortness of Breath;                 Risk Factors:Hypertension.  Sonographer:    Sheralyn Boatman RDCS Referring Phys: Maisie Fus  Sonographer Comments: Technically difficult study due to poor echo windows. Patient is post PCI IMPRESSIONS  1. Left ventricular ejection fraction, by estimation, is 55 to 60%. Left ventricular ejection fraction by 3D volume is 55 %. The left ventricle has normal function. The left ventricle demonstrates regional wall motion abnormalities (see scoring diagram/findings for description). There is moderate asymmetric left ventricular hypertrophy of the basal-septal segment. Indeterminate diastolic filling due to E-A fusion.  2. Right ventricular systolic function is hyperdynamic. The right ventricular size is normal.  3. The mitral valve is grossly normal. Trivial mitral valve regurgitation. No evidence of mitral stenosis.  4. The aortic valve is tricuspid. Aortic valve regurgitation is not visualized. No aortic stenosis is present.  5. The inferior vena cava is normal in size with greater than 50% respiratory variability, suggesting right atrial pressure of 3 mmHg.  6. Evidence of  atrial level shunting detected by color flow Doppler. Comparison(s): Prior images unable to be directly viewed, comparison made by report only. No prior Echocardiogram. FINDINGS  Left Ventricle: Left ventricular ejection fraction, by estimation, is 55 to 60%. Left ventricular ejection fraction by 3D volume is 55 %. The left ventricle has normal function. The left ventricle demonstrates regional wall motion abnormalities. The left ventricular internal cavity size was normal in size. There is moderate asymmetric left ventricular hypertrophy of the basal-septal segment. Indeterminate diastolic filling due to E-A fusion.  LV Wall Scoring: The mid anterior segment is akinetic. Right  Ventricle: The right ventricular size is normal. Right ventricular systolic function is hyperdynamic. Pericardium: There is no evidence of pericardial effusion. Presence of epicardial fat layer. Mitral Valve: The mitral valve is grossly normal. Trivial mitral valve regurgitation. No evidence of mitral valve stenosis. Tricuspid Valve: The tricuspid valve is normal in structure. Tricuspid valve regurgitation is trivial. No evidence of tricuspid stenosis. Aortic Valve: The aortic valve is tricuspid. Aortic valve regurgitation is not visualized. No aortic stenosis is present. Pulmonic Valve: The pulmonic valve was normal in structure. Pulmonic valve regurgitation is not visualized. Aorta: The aortic root, ascending aorta and aortic arch are all structurally normal, with no evidence of dilitation or obstruction. Venous: The inferior vena cava is normal in size with greater than 50% respiratory variability, suggesting right atrial pressure of 3 mmHg. IAS/Shunts: Evidence of atrial level shunting detected by color flow Doppler. Additional Comments: Spectral Doppler performed. Color Doppler performed.  LEFT VENTRICLE PLAX 2D LVIDd:         3.80 cm LVIDs:         2.10 cm LV PW:         1.00 cm         3D Volume EF LV IVS:        1.00 cm         LV 3D EF:    Left LVOT diam:     2.10 cm                      ventricul LVOT Area:     3.46 cm                     ar                                             ejection                                             fraction LV Volumes (MOD)                            by 3D LV vol d, MOD    65.6 ml                    volume is A2C:                                        55 %. LV vol d, MOD    73.0 ml A4C: LV vol  s, MOD    29.7 ml       3D Volume EF: A2C:                           3D EF:        55 % LV vol s, MOD    29.1 ml       LV EDV:       79 ml A4C:                           LV ESV:       35 ml LV SV MOD A2C:   35.9 ml       LV SV:        43 ml LV SV MOD A4C:   73.0 ml LV SV MOD  BP:    40.2 ml IVC IVC diam: 1.40 cm LEFT ATRIUM         Index LA diam:    1.50 cm 0.94 cm/m   AORTA Ao Root diam: 2.90 cm Ao Asc diam:  2.90 cm MITRAL VALVE MV Area (PHT): 4.15 cm     SHUNTS MV Decel Time: 183 msec     Systemic Diam: 2.10 cm MV E velocity: 56.10 cm/s MV A velocity: 118.00 cm/s MV E/A ratio:  0.48 Riley Lam MD Electronically signed by Riley Lam MD Signature Date/Time: 03/20/2023/3:51:38 PM    Final    CARDIAC CATHETERIZATION  Result Date: 03/20/2023   The left ventricular systolic function is normal.   LV end diastolic pressure is normal.   The left ventricular ejection fraction is 55-65% by visual estimate.   There is no aortic valve stenosis.   In the absence of any other complications or medical issues, we expect the patient to be ready for discharge from a cath perspective on 03/20/2023.   No angiographically apparent coronary artery disease. Results conveyed to [facility], [id number].  Continue to w/u other etiologies of shortness of breath.   MYOCARDIAL PERFUSION IMAGING  Result Date: 03/08/2023   Findings are consistent with ischemia. The study is intermediate risk.   LV perfusion is abnormal. Defect 1: There is a medium defect with moderate reduction in uptake present in the anterior location(s) that is reversible. There is normal wall motion in the defect area. Consistent with ischemia.   Left ventricular function is abnormal. Nuclear stress EF: 68%. The left ventricular ejection fraction is hyperdynamic (>65%). End diastolic cavity size is normal.   Prior study not available for comparison.   Disposition   Pt is being discharged home today in good condition.  Follow-up Plans & Appointments     Follow-up Information     Gregory, Chelsea Corporal, MD Follow up.   Specialty: Cardiology Why: office scheduler will contact you to arrange follow up Contact information: [address] [place] [address] [phone]                   Discharge  Medications   Allergies as of 03/20/2023       Reactions   Sulfa Antibiotics Other (See Comments)   Hallucinations   Cortizone-10 [hydrocortisone] Hives   Elemental Sulfur    Hallucinations    Celebrex [celecoxib] Other (See Comments)   Increase in blood pressure.   Ciprofloxacin Rash   Levofloxacin Nausea And Vomiting   Metronidazole Nausea Only   Severe rash   Promethazine Hcl Other (See Comments)  Right muscle spasm.        Medication List     STOP taking these medications    aspirin EC 81 MG tablet       TAKE these medications    alendronate 70 MG tablet Commonly known as: FOSAMAX Take 70 mg by mouth every Monday. Take with a full glass of water on an empty stomach.   ALPRAZolam 0.5 MG tablet Commonly known as: XANAX Take 0.5-1 mg by mouth at bedtime as needed for anxiety.   atorvastatin 20 MG tablet Commonly known as: LIPITOR Take 1 tablet (20 mg total) by mouth daily.   butalbital-acetaminophen-caffeine 50-325-40 MG tablet Commonly known as: FIORICET Take 1 tablet by mouth as needed for headache.   cholestyramine 4 g packet Commonly known as: QUESTRAN MIX 1 PACKET WITH LIQUID AND  DRINK BY MOUTH DAILY IN THE  AFTERNOON. TAKE 2 HOURS BEFORE  OR AFTER ALL OTHER MEDICATIONS   cycloSPORINE 0.05 % ophthalmic emulsion Commonly known as: RESTASIS Place 1 drop into both eyes 2 (two) times daily.   doxycycline 100 MG capsule Commonly known as: VIBRAMYCIN Take 100 mg by mouth 2 (two) times daily.   estradiol 0.1 MG/GM vaginal cream Commonly known as: ESTRACE Place 1 Applicatorful vaginally 3 (three) times a week.   fexofenadine 180 MG tablet Commonly known as: ALLEGRA Take 180 mg by mouth daily.   fluticasone 50 MCG/ACT nasal spray Commonly known as: FLONASE Place 2 sprays into both nostrils.   furosemide 20 MG tablet Commonly known as: Lasix Take 1 tablet (20 mg total) by mouth as needed.   loperamide 2 MG tablet Commonly known as: IMODIUM  A-D Take 1-3 tablets by mouth as directed.   meloxicam 7.5 MG tablet Commonly known as: MOBIC Take 7.5-15 mg by mouth daily.   metoprolol tartrate 25 MG tablet Commonly known as: LOPRESSOR Take 0.5 tablets (12.5 mg total) by mouth 2 (two) times daily.   montelukast 10 MG tablet Commonly known as: SINGULAIR Take 10 mg by mouth daily.   multivitamins ther. w/minerals Tabs tablet Take 1 tablet by mouth daily.   OSTEO BI-FLEX TRIPLE STRENGTH PO Take 1 tablet by mouth daily.   Tylenol 8 Hour Arthritis Pain 650 MG CR tablet Generic drug: acetaminophen Take 2,600 mg by mouth daily.   Vitamin D3 75 MCG (3000 UT) Tabs Take 3,000 Units by mouth daily.           Outstanding Labs/Studies   N/A  Duration of Discharge Encounter   Greater than 30 minutes including physician time.  Ramond Dial, PA 03/20/2023, 5:03 PM

## 2023-03-20 NOTE — Progress Notes (Signed)
Lower extremity venous bilateral study completed.   Please see CV Proc for preliminary results.   Rachel Hodge, RDMS, RVT  

## 2023-03-21 ENCOUNTER — Encounter (HOSPITAL_COMMUNITY): Payer: Self-pay | Admitting: Interventional Cardiology

## 2023-03-21 LAB — LIPOPROTEIN A (LPA): Lipoprotein (a): 23.6 nmol/L (ref <75.0)

## 2023-03-28 ENCOUNTER — Other Ambulatory Visit: Payer: Medicare Other

## 2023-04-10 ENCOUNTER — Ambulatory Visit: Payer: Medicare Other

## 2023-04-10 ENCOUNTER — Other Ambulatory Visit: Payer: Self-pay

## 2023-04-10 VITALS — BP 120/70 | HR 78 | Ht <= 58 in | Wt 147.2 lb

## 2023-04-10 DIAGNOSIS — G709 Myoneural disorder, unspecified: Secondary | ICD-10-CM | POA: Insufficient documentation

## 2023-04-10 DIAGNOSIS — R0609 Other forms of dyspnea: Secondary | ICD-10-CM | POA: Insufficient documentation

## 2023-04-10 DIAGNOSIS — N189 Chronic kidney disease, unspecified: Secondary | ICD-10-CM | POA: Insufficient documentation

## 2023-04-10 DIAGNOSIS — E782 Mixed hyperlipidemia: Secondary | ICD-10-CM | POA: Diagnosis present

## 2023-04-10 DIAGNOSIS — M1990 Unspecified osteoarthritis, unspecified site: Secondary | ICD-10-CM | POA: Insufficient documentation

## 2023-04-10 DIAGNOSIS — I10 Essential (primary) hypertension: Secondary | ICD-10-CM | POA: Insufficient documentation

## 2023-04-10 DIAGNOSIS — J302 Other seasonal allergic rhinitis: Secondary | ICD-10-CM | POA: Insufficient documentation

## 2023-04-10 DIAGNOSIS — K5730 Diverticulosis of large intestine without perforation or abscess without bleeding: Secondary | ICD-10-CM | POA: Insufficient documentation

## 2023-04-10 MED ORDER — SIMVASTATIN 20 MG PO TABS: ORAL_TABLET | ORAL | 3 refills | Status: DC

## 2023-04-10 NOTE — Patient Instructions (Signed)
Medication Instructions:  Your physician has recommended you make the following change in your medication:   Stop Atorvastatin  Stop Metoprolol  Start Simvastatin 20 mg twice weekly. Wait 10 days before starting.  *If you need a refill on your cardiac medications before your next appointment, please call your pharmacy*   Lab Work: None ordered If you have labs (blood work) drawn today and your tests are completely normal, you will receive your results only by: MyChart Message (if you have MyChart) OR A paper copy in the mail If you have any lab test that is abnormal or we need to change your treatment, we will call you to review the results.   Testing/Procedures: None ordered   Follow-Up: At [facility], you and your health needs are our priority.  As part of our continuing mission to provide you with exceptional heart care, we have created designated Provider Care Teams.  These Care Teams include your primary Cardiologist (physician) and Advanced Practice Providers (APPs -  Physician Assistants and Nurse Practitioners) who all work together to provide you with the care you need, when you need it.  We recommend signing up for the patient portal called "MyChart".  Sign up information is provided on this After Visit Summary.  MyChart is used to connect with patients for Virtual Visits (Telemedicine).  Patients are able to view lab/test results, encounter notes, upcoming appointments, etc.  Non-urgent messages can be sent to your provider as well.   To learn more about what you can do with MyChart, go to [email or web address].    Your next appointment:   12 month(s)  The format for your next appointment:   In Person  Provider:   Huntley Dec, MD    Other Instructions none  Important Information About Sugar

## 2023-04-10 NOTE — Assessment & Plan Note (Signed)
Symptoms improving. No obstructive coronary artery disease.  No significant LV dysfunction although apical wall motion abnormality reported on echocardiogram, this is not evident on my review of the images and LV gram during the cardiac catheterization.  She has self discontinued aspirin.  Okay to hold off aspirin at this time.  She has been feeling symptoms of tiredness with metoprolol. Will discontinue metoprolol.  Advised to continue with low-salt diet and monitor her weights regularly.  If weight gain over 2 to 3 pounds within a day or 4 pounds within a week, advised her to take her Lasix/furosemide 20 mg tablet as prescribed.  Discontinue atorvastatin given her muscle aches reported. Will try low-dose simvastatin 20 mg 3 times a week.

## 2023-04-10 NOTE — Progress Notes (Signed)
Cardiology Office Note:    Date:  04/10/2023   ID:  Chelsea Gregory, DOB 1945/09/04, MRN [id number]  PCP:  Krystal Clark, NP  Cardiologist:  Marlyn Corporal Ariah Mower, MD    Referring MD: Rhea Bleacher*   Here for a follow-up visit after cardiac catheterization.  History of Present Illness:    Chelsea Gregory is a 77 y.o. female last seen at office visit 03-09-2023, now here for follow-up visit after cardiac catheterization on 03-20-2023.  Briefly she is a 77 year old woman with complaints of dyspnea on exertion progressive, underwent stress test with nuclear imaging that was abnormal, concerning for ischemia, scheduled for coronary angiogram.  However she was having increasing symptoms with shortness of breath and presented to our office on 03-19-2023, and after nurse visit was sent to the ER for further evaluation.  Troponins were negative, D-dimer was unremarkable.  BNP was mildly elevated at 1140.  CTA of the chest showed no PE.  Subacute fracture of the left anterior fourth and fifth ribs demonstrated which were unchanged from prior.    Subsequently was transferred to [facility] for cath.  This has been completed and she was discharged home on low-dose metoprolol 12.5 mg twice daily and Lasix to be used as needed.  Cath done 03-20-2023 shows no significant obstructive coronary artery disease.  Hemodynamics normal with LVEDP normal and EF 55 to 65% by visual estimate. LVEDP 14 mmHg.  Left radial Access.  Limited echocardiogram done 03-20-2023 noted EF 55 to 60%, with asymmetric thickening of the basal to septal segment.  Mid anterior wall segment was reported to be akinetic.  Normal RV function, trace MR, evidence of atrial level shunting described identified by color Doppler..  Reviewed images myself.  No LV outflow tract obstruction.  Wall motion abnormalities do not appear prominent.  Vascular study 03-20-2023 noted no evidence of lower extremity DVT.  Overall she  has been doing well since discharge. Mentions her breathing is gradually improved.  She is able to ambulate better without any shortness of breath or chest pain. She has not required to take any further Lasix doses since discharge. Metoprolol that was started she feels is causing her to be tired and was wondering if we could discontinue metoprolol.   Past Medical History:  Diagnosis Date   Abdominal pain of unknown cause 10/21/2015   Last Assessment & Plan:   Relevant Hx:  Course:  Daily Update:  Today's Plan:still following with Dr. Maryruth Bun for this and she has pending FU with him for it as well and she indicates that she is still having discomfort and on exam was having some today though maybe not as bad, reviewed with her that would not have her take more abx for this though.     Electronically signed by: Servando Salina Bro   Abdominal pain, right lower quadrant 04/01/2014   Abnormal cardiovascular stress test 03/09/2023   Acute infection of nasal sinus 04/01/2014   Acute on chronic diastolic (congestive) heart failure (HCC) 03/20/2023   Anxiety 04/01/2014   Apnea, sleep 04/01/2014   Arthritis    Knees   Arthritis, degenerative 04/01/2014   Asthma, mild intermittent 10/08/2015   Last Assessment & Plan:   Relevant Hx:  Course:  Daily Update:  Today's Plan:this is stable for her at this time and will follow along for her     Electronically signed by: Krystal Clark, NP  10/28/15 1205     Avitaminosis D 04/01/2014   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   Chronic kidney disease    kidney infection post colon surgery on Macrodantin   Chronic rhinitis 04/01/2014   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   Last Assessment & Plan:   Relevant Hx:  Course:  Daily Update:  Today's Plan:this is going to be checked for her fasting today on current dose of med     Electronically signed by: Krystal Clark, NP  10/28/15 1256     Crescendo angina (HCC)  03/19/2023   DD (diverticular disease) 04/01/2014   Overview:   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     Degeneration of intervertebral disc of lumbosacral region 04/01/2014   Overview:   Taking shot therapy with pain center     Diaphragmatic hernia 04/01/2014   Diverticular disease of left colon    Dyspnea on exertion over the past month and a half 03/02/2023   Essential (primary) hypertension 10/08/2015   Last Assessment & Plan:   Relevant Hx:  Course:  Daily Update:  Today's Plan:this is stable for her at this time and will follow her along     Electronically signed by: Krystal Clark, NP  10/28/15 1205     Fatigue 03/02/2023   Gastro-esophageal reflux disease without esophagitis 10/08/2015   Last Assessment & Plan:   Relevant Hx:  Course:  Daily Update:  Today's Plan:this is stable for her at this time and will follow along for her      Electronically signed by: Krystal Clark, NP  10/28/15 1211     Generalized OA 10/08/2015   Last Assessment & Plan:   Relevant Hx:  Course:  Daily Update:  Today's Plan:she has chronic pain which I belive is just that chronic pain from her OA but she has had multiple tick bites in the last 2 mnths and we did discuss her having labs which were drawn for her today for tick borne illness .     Electronically signed by: Krystal Clark, NP  01/11/16 1335     Gonalgia 04/01/2014   Headache, migraine 04/01/2014   Lumbar canal stenosis 08/14/2011   Last Assessment & Plan:   Relevant Hx:  Course:  Daily Update:  Today's Plan:she again is following with ortho for this and she is to have review with him in June for discussion of surgery     Electronically signed by: Krystal Clark, NP  10/28/15 1300     Lumbar foraminal stenosis 03/14/2016   Malaise and fatigue 10/08/2015   Last Assessment & Plan:   Relevant Hx:  Course:  Daily Update:  Today's Plan:update the labs as we discussed and she is having some increased anxiety  worrying over her husband who is having some health issues right now and that could be an aggravating factor for her as well and she needs to address that with him and her concerns     Electronically signed by: Krystal Clark, NP  06/20/1   MRSA colonization 2009   Muscle ache 04/01/2014   Neuromuscular disorder (HCC)    Cervical disc   Osteopenia 04/01/2014   Seasonal allergies    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    Past Surgical History:  Procedure Laterality Date   ABDOMINAL HYSTERECTOMY  2006   ANTERIOR LAT LUMBAR FUSION Left 03/14/2016   Procedure: Extreme Lumbar Interbody Fusion Left Lumbar Two-Three,Lumbar Three-four,Lumbar Four-Five with Pedicle Screws;  Surgeon: Julio Sicks, MD;  Location: [facility];  Service: Neurosurgery;  Laterality: Left;  left approach   BACK SURGERY     CERVICAL DISCECTOMY  2010   CHOLECYSTECTOMY  2000   COLON SURGERY  2009   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, aso  had left ovary removed    Colosotmy  2009   COLOSTOMY CLOSURE  2010   EYE SURGERY     Implant to tear duct   HERNIA REPAIR  12/2009   Right   HERNIA REPAIR  2012   Left   JOINT REPLACEMENT Left 2015   LEFT HEART CATH AND CORONARY ANGIOGRAPHY N/A 03/20/2023   Procedure: LEFT HEART CATH AND CORONARY ANGIOGRAPHY;  Surgeon: Corky Crafts, MD;  Location: [facility] INVASIVE CV LAB;  Service: Cardiovascular;  Laterality: N/A;   LUMBAR LAMINECTOMY/DECOMPRESSION MICRODISCECTOMY  08/14/2011   Procedure: LUMBAR LAMINECTOMY/DECOMPRESSION MICRODISCECTOMY;  Surgeon: Temple Pacini, MD;  Location: [facility];  Service: Neurosurgery;  Laterality: Left;  Left Lumbar three-four, Bilateral Lumbar four-five decompressive lumbar laminectomy   LUMBAR PERCUTANEOUS PEDICLE SCREW 3 LEVEL Left 03/14/2016   Procedure: LUMBAR PERCUTANEOUS PEDICLE SCREW 3 LEVEL;  Surgeon: Julio Sicks, MD;  Location: [facility];  Service: Neurosurgery;  Laterality: Left;   TUBAL  LIGATION  1968    Current Medications: Current Meds  Medication Sig   acetaminophen (TYLENOL 8 HOUR ARTHRITIS PAIN) 650 MG CR tablet Take 2,600 mg by mouth daily.   alendronate (FOSAMAX) 70 MG tablet Take 70 mg by mouth every Monday. Take with a full glass of water on an empty stomach.   ALPRAZolam (XANAX) 0.5 MG tablet Take 0.5-1 mg by mouth at bedtime as needed for anxiety.   butalbital-acetaminophen-caffeine (FIORICET) 50-325-40 MG tablet Take 1 tablet by mouth as needed for headache.   Cholecalciferol (VITAMIN D3) 3000 units TABS Take 3,000 Units by mouth daily.    cholestyramine (QUESTRAN) 4 g packet MIX 1 PACKET WITH LIQUID AND  DRINK BY MOUTH DAILY IN THE  AFTERNOON. TAKE 2 HOURS BEFORE  OR AFTER ALL OTHER MEDICATIONS   cycloSPORINE (RESTASIS) 0.05 % ophthalmic emulsion Place 1 drop into both eyes 2 (two) times daily.   estradiol (ESTRACE) 0.1 MG/GM vaginal cream Place 1 Applicatorful vaginally 3 (three) times a week.   fexofenadine (ALLEGRA) 180 MG tablet Take 180 mg by mouth daily.   fluticasone (FLONASE) 50 MCG/ACT nasal spray Place 2 sprays into both nostrils.   furosemide (LASIX) 20 MG tablet Take 20 mg by mouth daily as needed for fluid or edema.   loperamide (IMODIUM A-D) 2 MG tablet Take 1-3 tablets by mouth as directed.   meloxicam (MOBIC) 7.5 MG tablet Take 7.5-15 mg by mouth daily.   methenamine (HIPREX) 1 g tablet Take 1 g by mouth 2 (two) times daily with a meal.   Misc Natural Products (OSTEO BI-FLEX TRIPLE STRENGTH PO) Take 1 tablet by mouth daily.   montelukast (SINGULAIR) 10 MG tablet Take 10 mg by mouth daily.   Multiple Vitamins-Minerals (MULTIVITAMINS THER. W/MINERALS) TABS Take 1 tablet by mouth daily.     simvastatin (ZOCOR) 20 MG tablet Take 20 mg (1 tablet) twice a week.   [DISCONTINUED] atorvastatin (LIPITOR) 20 MG tablet Take 1 tablet (20 mg total) by mouth daily.   [DISCONTINUED] metoprolol tartrate (LOPRESSOR) 25 MG tablet Take 0.5 tablets (12.5 mg total)  by mouth 2 (two) times daily.     Allergies:   Sulfa antibiotics, Cortizone-10 [hydrocortisone], Elemental sulfur, Celebrex [celecoxib], Ciprofloxacin, Levofloxacin, Metronidazole, and Promethazine hcl   Social History   Socioeconomic History   Marital  status: Married    Spouse name: Not on file   Number of children: 2   Years of education: Not on file   Highest education level: Not on file  Occupational History   Not on file  Tobacco Use   Smoking status: Never    Passive exposure: Never   Smokeless tobacco: Never  Vaping Use   Vaping status: Never Used  Substance and Sexual Activity   Alcohol use: No   Drug use: No   Sexual activity: Not on file  Other Topics Concern   Not on file  Social History Narrative   Not on file   Social Determinants of Health   Financial Resource Strain: Low Risk  (03/29/2022)   Received from [facility], [facility] visits prior to 09/23/2022., [facility] visits prior to 09/23/2022.   Overall Financial Resource Strain (CARDIA)    Difficulty of Paying Living Expenses: Not hard at all  Food Insecurity: No Food Insecurity (03/19/2023)   Hunger Vital Sign    Worried About Running Out of Food in the Last Year: Never true    Ran Out of Food in the Last Year: Never true  Transportation Needs: No Transportation Needs (03/19/2023)   PRAPARE - Administrator, Civil Service (Medical): No    Lack of Transportation (Non-Medical): No  Physical Activity: Unknown (03/29/2022)   Received from [facility], [facility] visits prior to 09/23/2022., [facility] visits prior to 09/23/2022.   Exercise Vital Sign    Days of Exercise per Week: Patient declined    Minutes of Exercise per Session: Not on file  Stress: No Stress Concern Present (03/29/2022)   Received from [facility], [facility] visits prior to 09/23/2022., [facility] visits prior to 09/23/2022.   [employer] of Occupational Health - Occupational Stress Questionnaire    Feeling of Stress : Only a little  Social Connections: Socially Integrated (03/29/2022)   [facility], [facility] visits prior to 09/23/2022., [facility] visits prior to 09/23/2022.   Social Connection and Isolation Panel [NHANES]    Frequency of Communication with Friends and Family: More than three times a week    Frequency of Social Gatherings with Friends and Family: Three times a week    Attends Religious Services: More than 4 times per year    Active Member of Clubs or Organizations: Yes    Attends Banker Meetings: More than 4 times per year    Marital Status: Married     Family History: The patient's family history is negative for Anesthesia problems and Colon cancer. ROS:   Please see the history of present illness.    All 14 point review of systems negative except as described per history of present illness  EKGs/Labs/Other Studies Reviewed:         Recent Labs: 03/19/2023: Magnesium 2.0; TSH 3.883 03/20/2023: BUN 13; Creatinine, Ser 0.92; Hemoglobin 12.6; Platelets 269; Potassium 3.7; Sodium 137  Recent Lipid Panel    Component Value Date/Time   CHOL 126 03/20/2023 0635   TRIG 105 03/20/2023 0635   HDL 63 03/20/2023 0635   CHOLHDL 2.0 03/20/2023 0635   VLDL 21 03/20/2023 0635   LDLCALC 42 03/20/2023 0635    Physical Exam:    VS:  BP 120/70   Pulse 78   Ht 4\' 10"  (1.473  m)   Wt 147 lb 3.2 oz (66.8 kg)   SpO2 98%   BMI 30.76 kg/m     Wt Readings from Last 3 Encounters:  04/10/23 147 lb 3.2 oz (66.8 kg)  03/20/23 145 lb 11.6 oz (66.1 kg)  03/09/23 149 lb 3.2 oz (67.7 kg)     GENERAL:  Well nourished, well developed in no acute distress extremities: No pitting pedal edema. Pulses bilaterally symmetric with radial 2+ NEUROLOGIC:  Alert and oriented x 3   ASSESSMENT AND  PLAN:   Ms Rold 77 year old woman with history of obstructive sleep apnea, diverticulosis, back pain and neck pain, GERD with dyspnea on exertion since she sustained a fall few weeks ago, subsequently abnormal stress test with nuclear imaging and progressive symptoms, now s/p cardiac cath. Symptoms likely related to chest wall injury from the fall and possibly diastolic dysfunction.  1.  No significant coronary artery disease on  Problem List Items Addressed This Visit     Essential (primary) hypertension - Primary   Relevant Medications   simvastatin (ZOCOR) 20 MG tablet   Combined fat and carbohydrate induced hyperlipemia   Relevant Medications   simvastatin (ZOCOR) 20 MG tablet   Dyspnea on exertion over the past month and a half    Symptoms improving. No obstructive coronary artery disease.  No significant LV dysfunction although apical wall motion abnormality reported on echocardiogram, this is not evident on my review of the images and LV gram during the cardiac catheterization.  She has self discontinued aspirin.  Okay to hold off aspirin at this time.  She has been feeling symptoms of tiredness with metoprolol. Will discontinue metoprolol.  Advised to continue with low-salt diet and monitor her weights regularly.  If weight gain over 2 to 3 pounds within a day or 4 pounds within a week, advised her to take her Lasix/furosemide 20 mg tablet as prescribed.  Discontinue atorvastatin given her muscle aches reported. Will try low-dose simvastatin 20 mg 3 times a week.       Return to clinic in 1 year or as needed.  Medication Adjustments/Labs and Tests Ordered: Current medicines are reviewed at length with the patient today.  Concerns regarding medicines are outlined above.  No orders of the defined types were placed in this encounter.  Medication changes:  Meds ordered this encounter  Medications   simvastatin (ZOCOR) 20 MG tablet    Sig: Take 20 mg (1 tablet) twice a  week.    Dispense:  24 tablet    Refill:  3    Signed, Tippi Mccrae reddy Cherylann Hobday, MD, MPH, [facility] 04/10/2023 2:05 PM    [facility]

## 2023-06-08 ENCOUNTER — Telehealth: Payer: Self-pay

## 2023-06-08 NOTE — Telephone Encounter (Signed)
Pt c/o Shortness Of Breath: STAT if SOB developed within the last 24 hours or pt is noticeably SOB on the phone  1. Are you currently SOB (can you hear that pt is SOB on the phone)? Yes   2. How long have you been experiencing SOB? Started 3 weeks ago - this week is worse.   3. Are you SOB when sitting or when up moving around? Up moving around   4. Are you currently experiencing any other symptoms? States that she felt like something heavy was sitting on her chest.

## 2023-06-08 NOTE — Telephone Encounter (Signed)
Spoke with patient. Relayed message from Dr. Vincent Gros. She voiced understanding.

## 2023-06-08 NOTE — Telephone Encounter (Signed)
[name], Chelsea Corporal, MD  [name], RN Caller: Unspecified (Today,  8:14 AM) She is 77/F with normal coronaries on cath recently, echocardiogram with normal LVEF with moderate asymmetric basal septal hypertrophy without any outflow tract obstruction, there was mention of mild anterior wall motion abnormality which was not very evident on LV gram at the time of cath.  Overall I do not think the symptoms she is reporting are cardiac related.  If she is acutely symptomatic should head to the ER for further evaluation. Her symptoms sound more of pulmonary origin, reactive airway disease versus infection and need to be evaluated for that.  Thank you.
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
## 2023-08-15 ENCOUNTER — Encounter (HOSPITAL_BASED_OUTPATIENT_CLINIC_OR_DEPARTMENT_OTHER): Payer: Self-pay | Admitting: Pulmonary Disease

## 2023-08-15 ENCOUNTER — Ambulatory Visit (INDEPENDENT_AMBULATORY_CARE_PROVIDER_SITE_OTHER): Payer: Medicare Other

## 2023-08-15 ENCOUNTER — Ambulatory Visit (INDEPENDENT_AMBULATORY_CARE_PROVIDER_SITE_OTHER): Payer: Medicare Other | Admitting: Pulmonary Disease

## 2023-08-15 VITALS — BP 122/88 | HR 68 | Resp 22 | Ht <= 58 in | Wt 144.9 lb

## 2023-08-15 DIAGNOSIS — R0602 Shortness of breath: Secondary | ICD-10-CM

## 2023-08-15 DIAGNOSIS — R0781 Pleurodynia: Secondary | ICD-10-CM

## 2023-08-15 NOTE — Patient Instructions (Signed)
Shortness of breath - limited to rib pain, deconditioning. Asthma seems well controlled --ORDER CXR for left rib pain. Will call if abnormal --Refer to [facility] --Incentive spirometry three times a day --Continue tylenol four times a day --Recommend ibuprofen 200 mg in the morning and evening with food

## 2023-08-15 NOTE — Progress Notes (Signed)
Subjective:   PATIENT ID: Chelsea Gregory GENDER: female DOB: 22-Oct-1945, MRN: [id number]  Chief Complaint  Patient presents with   Consult    SOB- July 9th fell head first on cement and hit head and chest got HACT of head and chest in August were normal- Breathing declined-had heart testing at [facility] was told heart was good and needed Pulmonary testing. ER 3 times so far- was put on advair but doesn't feel like its helping. SOB has gotten worse in last few weeks.      Reason for Visit: New consult for shortness of breath  Ms. Chelsea Gregory is a 78 year old female never smoker with OSA, asthma, allergic rhinitis who presents for evaluation for shortness of breath.  She was previously seen by Dr. Blenda Nicely for sleep apnea on BiPAP, initially diagnosed 10-12 years ago and asthma overall well-controlled and has been on as needed albuterol and allergy shots every 3-4 months.  She began having worsening shortness of breath in fall 2024 associated with chest tightness that is constant and worsened with activity. Improved with lying down but still some symptoms. Of note she had 2 cracked ribs after a fall in July and seen in ED with CTA 02/27/23 - neg for pulmonary embolism. Mild dependent and lower lobe atelectasis, mild chronic scarring/atelectasis of RML. No effusion.   She has been seen by her PCP she has been compliant with Advair 100-50 but no improvement. Denies wheezing or coughing. Has been walking in house and housework but its difficult for her. No longer regular walking outside of the home. Uses incentive spirometry up to 1400 cc. Prescribed xanax in the ED but does not use it. Uses tylenol for arthritis 650 mg four times a day. Unable to take ibuprofen with hx of stomach issues.   Social History: Never smoker  I have personally reviewed patient's past medical/family/social history, allergies, current medications.  Past Medical History:  Diagnosis Date   Abdominal pain of unknown  cause 10/21/2015   Last Assessment & Plan:   Relevant Hx:  Course:  Daily Update:  Today's Plan:still following with Dr. Maryruth Bun for this and she has pending FU with him for it as well and she indicates that she is still having discomfort and on exam was having some today though maybe not as bad, reviewed with her that would not have her take more abx for this though.     Electronically signed by: Servando Salina Bro   Abdominal pain, right lower quadrant 04/01/2014   Abnormal cardiovascular stress test 03/09/2023   Acute infection of nasal sinus 04/01/2014   Acute on chronic diastolic (congestive) heart failure (HCC) 03/20/2023   Anxiety 04/01/2014   Apnea, sleep 04/01/2014   Arthritis    Knees   Arthritis, degenerative 04/01/2014   Asthma, mild intermittent 10/08/2015   Last Assessment & Plan:   Relevant Hx:  Course:  Daily Update:  Today's Plan:this is stable for her at this time and will follow along for her     Electronically signed by: Krystal Clark, NP  10/28/15 1205     Avitaminosis D 04/01/2014   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   Chronic kidney disease    kidney infection post colon surgery on Macrodantin   Chronic rhinitis 04/01/2014   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   Last Assessment & Plan:   Relevant Hx:  Course:  Daily Update:  Today's Plan:this is going to be checked for her fasting today on  current dose of med     Electronically signed by: Krystal Clark, NP  10/28/15 1256     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   DD (diverticular disease) 04/01/2014   Overview:   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     Degeneration of intervertebral disc of lumbosacral region 04/01/2014   Overview:   Taking shot therapy with pain center     Diaphragmatic hernia 04/01/2014   Diverticular disease of left colon    Dyspnea on exertion over the past month and a half 03/02/2023   Essential (primary) hypertension 10/08/2015   Last Assessment &  Plan:   Relevant Hx:  Course:  Daily Update:  Today's Plan:this is stable for her at this time and will follow her along     Electronically signed by: Krystal Clark, NP  10/28/15 1205     Fatigue 03/02/2023   Gastro-esophageal reflux disease without esophagitis 10/08/2015   Last Assessment & Plan:   Relevant Hx:  Course:  Daily Update:  Today's Plan:this is stable for her at this time and will follow along for her      Electronically signed by: Krystal Clark, NP  10/28/15 1211     Generalized OA 10/08/2015   Last Assessment & Plan:   Relevant Hx:  Course:  Daily Update:  Today's Plan:she has chronic pain which I belive is just that chronic pain from her OA but she has had multiple tick bites in the last 2 mnths and we did discuss her having labs which were drawn for her today for tick borne illness .     Electronically signed by: Krystal Clark, NP  01/11/16 1335     Gonalgia 04/01/2014   Headache, migraine 04/01/2014   Lumbar canal stenosis 08/14/2011   Last Assessment & Plan:   Relevant Hx:  Course:  Daily Update:  Today's Plan:she again is following with ortho for this and she is to have review with him in June for discussion of surgery     Electronically signed by: Krystal Clark, NP  10/28/15 1300     Lumbar foraminal stenosis 03/14/2016   Malaise and fatigue 10/08/2015   Last Assessment & Plan:   Relevant Hx:  Course:  Daily Update:  Today's Plan:update the labs as we discussed and she is having some increased anxiety worrying over her husband who is having some health issues right now and that could be an aggravating factor for her as well and she needs to address that with him and her concerns     Electronically signed by: Krystal Clark, NP  06/20/1   MRSA colonization 2009   Muscle ache 04/01/2014   Neuromuscular disorder (HCC)    Cervical disc   Osteopenia 04/01/2014   Seasonal allergies    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     Family History  Problem Relation Age of Onset   Anesthesia problems Neg Hx    Colon cancer Neg Hx      Social History   Occupational History   Not on file  Tobacco Use   Smoking status: Never    Passive exposure: Never   Smokeless tobacco: Never  Vaping Use   Vaping status: Never Used  Substance and Sexual Activity   Alcohol use: No   Drug use: No   Sexual activity: Not on file    Allergies  Allergen Reactions   Sulfa Antibiotics Other (See Comments)    Hallucinations   Cortizone-10 [  Hydrocortisone] Hives   Elemental Sulfur     Hallucinations    Celebrex [Celecoxib] Other (See Comments)    Increase in blood pressure.   Ciprofloxacin Rash   Levofloxacin Nausea And Vomiting   Metronidazole Nausea Only    Severe rash   Promethazine Hcl Other (See Comments)    Right muscle spasm.     Outpatient Medications Prior to Visit  Medication Sig Dispense Refill   acetaminophen (TYLENOL 8 HOUR ARTHRITIS PAIN) 650 MG CR tablet Take 2,600 mg by mouth daily.     alendronate (FOSAMAX) 70 MG tablet Take 70 mg by mouth every Monday. Take with a full glass of water on an empty stomach.     ALPRAZolam (XANAX) 0.5 MG tablet Take 0.5-1 mg by mouth at bedtime as needed for anxiety.     butalbital-acetaminophen-caffeine (FIORICET) 50-325-40 MG tablet Take 1 tablet by mouth as needed for headache.     Cholecalciferol (VITAMIN D3) 3000 units TABS Take 3,000 Units by mouth daily.      cholestyramine (QUESTRAN) 4 g packet MIX 1 PACKET WITH LIQUID AND  DRINK BY MOUTH DAILY IN THE  AFTERNOON. TAKE 2 HOURS BEFORE  OR AFTER ALL OTHER MEDICATIONS 120 each 3   cycloSPORINE (RESTASIS) 0.05 % ophthalmic emulsion Place 1 drop into both eyes 2 (two) times daily.     estradiol (ESTRACE) 0.1 MG/GM vaginal cream Place 1 Applicatorful vaginally 3 (three) times a week.     fexofenadine (ALLEGRA) 180 MG tablet Take 180 mg by mouth daily.     fluticasone (FLONASE) 50  MCG/ACT nasal spray Place 2 sprays into both nostrils.     loperamide (IMODIUM A-D) 2 MG tablet Take 1-3 tablets by mouth as directed.     meloxicam (MOBIC) 7.5 MG tablet Take 7.5-15 mg by mouth daily.     methenamine (HIPREX) 1 g tablet Take 1 g by mouth 2 (two) times daily with a meal.     Misc Natural Products (OSTEO BI-FLEX TRIPLE STRENGTH PO) Take 1 tablet by mouth daily.     montelukast (SINGULAIR) 10 MG tablet Take 10 mg by mouth daily.     Multiple Vitamins-Minerals (MULTIVITAMINS THER. W/MINERALS) TABS Take 1 tablet by mouth daily.       fluticasone-salmeterol (ADVAIR) 100-50 MCG/ACT AEPB Inhale 1 puff into the lungs 2 (two) times daily.     furosemide (LASIX) 20 MG tablet Take 20 mg by mouth daily as needed for fluid or edema.     simvastatin (ZOCOR) 20 MG tablet Take 20 mg (1 tablet) twice a week. 24 tablet 3   No facility-administered medications prior to visit.    Review of Systems  Constitutional:  Negative for chills, diaphoresis, fever, malaise/fatigue and weight loss.  HENT:  Negative for congestion.   Respiratory:  Positive for shortness of breath. Negative for cough, hemoptysis, sputum production and wheezing.   Cardiovascular:  Negative for chest pain, palpitations and leg swelling.     Objective:   Vitals:   08/15/23 1055  BP: 122/88  Pulse: 68  Resp: (!) 22  SpO2: 99%  Weight: 144 lb 14.4 oz (65.7 kg)  Height: 4\' 9"  (1.448 m)   SpO2: 99 %  Physical Exam: General: Well-appearing, no acute distress HENT: [place], AT Eyes: EOMI, no scleral icterus Respiratory: Clear to auscultation bilaterally.  No crackles, wheezing or rales Cardiovascular: RRR, -M/R/G, no JVD Extremities:-Edema,-tenderness Neuro: AAO x4, CNII-XII grossly intact Psych: Normal mood, normal affect  Data Reviewed:  Imaging: CTA 02/27/23 -  neg for pulmonary embolism. Mild dependent and lower lobe atelectasis, mild chronic scarring/atelectasis of RML. No effusion. Unchanged right  hemidiaphragm.  PFT: None on file  Labs: CBC    Component Value Date/Time   WBC 8.6 03/20/2023 0635   RBC 3.99 03/20/2023 0635   HGB 12.6 03/20/2023 0635   HCT 37.5 03/20/2023 0635   PLT 269 03/20/2023 0635   MCV 94.0 03/20/2023 0635   MCH 31.6 03/20/2023 0635   MCHC 33.6 03/20/2023 0635   RDW 12.6 03/20/2023 0635   LYMPHSABS 2.5 03/07/2016 1615   MONOABS 0.4 03/07/2016 1615   EOSABS 0.1 03/07/2016 1615   BASOSABS 0.0 03/07/2016 1615   Absolute eos 08/15/23     Assessment & Plan:   Discussion: 78 year old female never smoker with OSA, asthma, allergic rhinitis who presents for evaluation for shortness of breath. Reviewed history and imaging. Her shortness of breath is suspected to be due to respiratory splinting due to rib pain. She has had no response to ICS/LABA and asthma symptoms are not consistent with history. Recommendations including OTC pain control, IS and PT recommended.  Shortness of breath - limited to rib pain, deconditioning. Asthma seems well controlled with PRN SABA and not affected by maintenance inhaler --ORDER CXR for left rib pain. Will call if abnormal --Refer to [facility] --Incentive spirometry three times a day --Continue tylenol four times a day --Recommend ibuprofen 200 mg in the morning and evening with food --OK to stop maintenance inhaler   Health Maintenance Immunization History  Administered Date(s) Administered   Moderna Sars-Covid-2 Vaccination 08/22/2019, 09/24/2019   CT Lung Screen - not qualified  Orders Placed This Encounter  Procedures   DG Chest 2 View    Standing Status:   Future    Expiration Date:   08/14/2024    Reason for Exam (SYMPTOM  OR DIAGNOSIS REQUIRED):   chest pain    Preferred imaging location?:   [facility]   Ambulatory referral to Physical Therapy    Referral Priority:   Urgent    Referral Type:   Physical Medicine    Referral Reason:   Specialty Services Required    Requested  Specialty:   Physical Therapy    Number of Visits Requested:   1  No orders of the defined types were placed in this encounter.   Return for 2/19 at 10 AM - same day as husband.  I have spent a total time of 45-minutes on the day of the appointment reviewing prior documentation, coordinating care and discussing medical diagnosis and plan with the patient/family. Imaging, labs and tests included in this note have been reviewed and interpreted independently by me.  Bev Drennen Mechele Collin, MD [facility] 08/15/2023 11:40 AM

## 2023-09-12 ENCOUNTER — Institutional Professional Consult (permissible substitution) (HOSPITAL_BASED_OUTPATIENT_CLINIC_OR_DEPARTMENT_OTHER): Payer: Medicare Other | Admitting: Pulmonary Disease

## 2023-09-12 ENCOUNTER — Ambulatory Visit (HOSPITAL_BASED_OUTPATIENT_CLINIC_OR_DEPARTMENT_OTHER): Payer: Medicare Other | Admitting: Pulmonary Disease

## 2023-09-12 ENCOUNTER — Encounter (HOSPITAL_BASED_OUTPATIENT_CLINIC_OR_DEPARTMENT_OTHER): Payer: Self-pay | Admitting: Pulmonary Disease

## 2023-09-12 VITALS — BP 128/86 | HR 71 | Ht <= 58 in | Wt 147.7 lb

## 2023-09-12 DIAGNOSIS — R0602 Shortness of breath: Secondary | ICD-10-CM

## 2023-09-12 DIAGNOSIS — T17908A Unspecified foreign body in respiratory tract, part unspecified causing other injury, initial encounter: Secondary | ICD-10-CM

## 2023-09-12 NOTE — Patient Instructions (Signed)
Shortness of breath - improved with PT. Likely secondary to rib pain, deconditioning. Asthma seems well controlled with PRN SABA and not affected by maintenance inhaler --Continue sessions with [facility] --Incentive spirometry three times a day --Continue tylenol four times a day --Recommend ibuprofen 200 mg in the morning and evening with food as needed. Use sparingly.  Aspiration --Discussed and provided handout for aspiration precautions

## 2023-09-12 NOTE — Progress Notes (Unsigned)
Subjective:   PATIENT ID: Chelsea Gregory GENDER: female DOB: [birth year], MRN: [id number]  No chief complaint on file.   Reason for Visit: Follow-up for shortness of breath  Ms. [name] is a 78 year old female never smoker with OSA, asthma, allergic rhinitis who presents for follow-up shortness of breath.  Initial consult She was previously seen by Dr. Blenda Nicely for sleep apnea on BiPAP, initially diagnosed 10-12 years ago and asthma overall well-controlled and has been on as needed albuterol and allergy shots every 3-4 months.  She began having worsening shortness of breath in fall 2024 associated with chest tightness that is constant and worsened with activity. Improved with lying down but still some symptoms. Of note she had 2 cracked ribs after a fall in July and seen in ED with CTA 02/27/23 - neg for pulmonary embolism. Mild dependent and lower lobe atelectasis, mild chronic scarring/atelectasis of RML. No effusion.   She has been seen by her PCP she has been compliant with Advair 100-50 but no improvement. Denies wheezing or coughing. Has been walking in house and housework but its difficult for her. No longer regular walking outside of the home. Uses incentive spirometry up to 1400 cc. Prescribed xanax in the ED but does not use it. Uses tylenol for arthritis 650 mg four times a day. Unable to take ibuprofen with hx of stomach issues.   09/12/23 Since our last visit, she was evaluated for shortness of breath. Thought to be splinting. Rx PT and OTC meds for control and feel this is helping.  Maintenance inhaler also discontinued and no worsening of shortness of breath. She reports a new problems that when she drinks and going down her windpipe. Associated with coughing.   Social History: Never smoker  Past Medical History:  Diagnosis Date   Abdominal pain of unknown cause 10/21/2015   Last Assessment & Plan:   Relevant Hx:  Course:  Daily Update:  Today's Plan:still following  with Dr. Maryruth Bun for this and she has pending FU with him for it as well and she indicates that she is still having discomfort and on exam was having some today though maybe not as bad, reviewed with her that would not have her take more abx for this though.     Electronically signed by: Servando Salina Bro   Abdominal pain, right lower quadrant 04/01/2014   Abnormal cardiovascular stress test 03/09/2023   Acute infection of nasal sinus 04/01/2014   Acute on chronic diastolic (congestive) heart failure (HCC) 03/20/2023   Anxiety 04/01/2014   Apnea, sleep 04/01/2014   Arthritis    Knees   Arthritis, degenerative 04/01/2014   Asthma, mild intermittent 10/08/2015   Last Assessment & Plan:   Relevant Hx:  Course:  Daily Update:  Today's Plan:this is stable for her at this time and will follow along for her     Electronically signed by: Krystal Clark, NP  10/28/15 1205     Avitaminosis D 04/01/2014   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   Chronic kidney disease    kidney infection post colon surgery on Macrodantin   Chronic rhinitis 04/01/2014   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   Last Assessment & Plan:   Relevant Hx:  Course:  Daily Update:  Today's Plan:this is going to be checked for her fasting today on current dose of med     Electronically signed by: Krystal Clark, NP  10/28/15 1256     Crescendo angina (  HCC) 03/19/2023   DD (diverticular disease) 04/01/2014   Overview:   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     Degeneration of intervertebral disc of lumbosacral region 04/01/2014   Overview:   Taking shot therapy with pain center     Diaphragmatic hernia 04/01/2014   Diverticular disease of left colon    Dyspnea on exertion over the past month and a half 03/02/2023   Essential (primary) hypertension 10/08/2015   Last Assessment & Plan:   Relevant Hx:  Course:  Daily Update:  Today's Plan:this is stable for her at this time and will follow her  along     Electronically signed by: Krystal Clark, NP  10/28/15 1205     Fatigue 03/02/2023   Gastro-esophageal reflux disease without esophagitis 10/08/2015   Last Assessment & Plan:   Relevant Hx:  Course:  Daily Update:  Today's Plan:this is stable for her at this time and will follow along for her      Electronically signed by: Krystal Clark, NP  10/28/15 1211     Generalized OA 10/08/2015   Last Assessment & Plan:   Relevant Hx:  Course:  Daily Update:  Today's Plan:she has chronic pain which I belive is just that chronic pain from her OA but she has had multiple tick bites in the last 2 mnths and we did discuss her having labs which were drawn for her today for tick borne illness .     Electronically signed by: Krystal Clark, NP  01/11/16 1335     Gonalgia 04/01/2014   Headache, migraine 04/01/2014   Lumbar canal stenosis 08/14/2011   Last Assessment & Plan:   Relevant Hx:  Course:  Daily Update:  Today's Plan:she again is following with ortho for this and she is to have review with him in June for discussion of surgery     Electronically signed by: Krystal Clark, NP  10/28/15 1300     Lumbar foraminal stenosis 03/14/2016   Malaise and fatigue 10/08/2015   Last Assessment & Plan:   Relevant Hx:  Course:  Daily Update:  Today's Plan:update the labs as we discussed and she is having some increased anxiety worrying over her husband who is having some health issues right now and that could be an aggravating factor for her as well and she needs to address that with him and her concerns     Electronically signed by: Krystal Clark, NP  06/20/1   MRSA colonization 2009   Muscle ache 04/01/2014   Neuromuscular disorder (HCC)    Cervical disc   Osteopenia 04/01/2014   Seasonal allergies    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     Family History  Problem Relation Age of Onset   Anesthesia problems  Neg Hx    Colon cancer Neg Hx      Social History   Occupational History   Not on file  Tobacco Use   Smoking status: Never    Passive exposure: Never   Smokeless tobacco: Never  Vaping Use   Vaping status: Never Used  Substance and Sexual Activity   Alcohol use: No   Drug use: No   Sexual activity: Not on file    Allergies  Allergen Reactions   Sulfa Antibiotics Other (See Comments)    Hallucinations   Cortizone-10 [Hydrocortisone] Hives   Elemental Sulfur     Hallucinations    Celebrex [Celecoxib] Other (See Comments)    Increase in  blood pressure.   Ciprofloxacin Rash   Levofloxacin Nausea And Vomiting   Metronidazole Nausea Only    Severe rash   Promethazine Hcl Other (See Comments)    Right muscle spasm.     Outpatient Medications Prior to Visit  Medication Sig Dispense Refill   acetaminophen (TYLENOL 8 HOUR ARTHRITIS PAIN) 650 MG CR tablet Take 2,600 mg by mouth daily.     alendronate (FOSAMAX) 70 MG tablet Take 70 mg by mouth every Monday. Take with a full glass of water on an empty stomach.     ALPRAZolam (XANAX) 0.5 MG tablet Take 0.5-1 mg by mouth at bedtime as needed for anxiety.     butalbital-acetaminophen-caffeine (FIORICET) 50-325-40 MG tablet Take 1 tablet by mouth as needed for headache.     Cholecalciferol (VITAMIN D3) 3000 units TABS Take 3,000 Units by mouth daily.      cholestyramine (QUESTRAN) 4 g packet MIX 1 PACKET WITH LIQUID AND  DRINK BY MOUTH DAILY IN THE  AFTERNOON. TAKE 2 HOURS BEFORE  OR AFTER ALL OTHER MEDICATIONS 120 each 3   cycloSPORINE (RESTASIS) 0.05 % ophthalmic emulsion Place 1 drop into both eyes 2 (two) times daily.     estradiol (ESTRACE) 0.1 MG/GM vaginal cream Place 1 Applicatorful vaginally 3 (three) times a week.     fexofenadine (ALLEGRA) 180 MG tablet Take 180 mg by mouth daily.     fluticasone (FLONASE) 50 MCG/ACT nasal spray Place 2 sprays into both nostrils.     fluticasone-salmeterol (ADVAIR) 100-50 MCG/ACT AEPB  Inhale 1 puff into the lungs 2 (two) times daily.     loperamide (IMODIUM A-D) 2 MG tablet Take 1-3 tablets by mouth as directed.     meloxicam (MOBIC) 7.5 MG tablet Take 7.5-15 mg by mouth daily.     methenamine (HIPREX) 1 g tablet Take 1 g by mouth 2 (two) times daily with a meal.     Misc Natural Products (OSTEO BI-FLEX TRIPLE STRENGTH PO) Take 1 tablet by mouth daily.     montelukast (SINGULAIR) 10 MG tablet Take 10 mg by mouth daily.     Multiple Vitamins-Minerals (MULTIVITAMINS THER. W/MINERALS) TABS Take 1 tablet by mouth daily.       No facility-administered medications prior to visit.    ROS   Objective:   There were no vitals filed for this visit.    Physical Exam: General: Well-appearing, no acute distress HENT: [place], AT Eyes: EOMI, no scleral icterus Respiratory: ***Clear to auscultation bilaterally.  No crackles, wheezing or rales Cardiovascular: RRR, -M/R/G, no JVD Extremities:-Edema,-tenderness Neuro: AAO x4, CNII-XII grossly intact Psych: Normal mood, normal affect  Data Reviewed:  Imaging: CTA 02/27/23 - neg for pulmonary embolism. Mild dependent and lower lobe atelectasis, mild chronic scarring/atelectasis of RML. No effusion. Unchanged right hemidiaphragm.  CXR 08/15/23 - Normal  PFT: None on file  Labs: CBC    Component Value Date/Time   WBC 8.6 03/20/2023 0635   RBC 3.99 03/20/2023 0635   HGB 12.6 03/20/2023 0635   HCT 37.5 03/20/2023 0635   PLT 269 03/20/2023 0635   MCV 94.0 03/20/2023 0635   MCH 31.6 03/20/2023 0635   MCHC 33.6 03/20/2023 0635   RDW 12.6 03/20/2023 0635   LYMPHSABS 2.5 03/07/2016 1615   MONOABS 0.4 03/07/2016 1615   EOSABS 0.1 03/07/2016 1615   BASOSABS 0.0 03/07/2016 1615   Absolute eos 08/15/23     Assessment & Plan:   Discussion: 78 year old female never smoker with OSA, asthma,  allergic rhinitis who presents for evaluation for shortness of breath. Reviewed history and imaging. Her shortness of breath is suspected to  be due to respiratory splinting due to rib pain. She has had no response to ICS/LABA and asthma symptoms are not consistent with history. Recommendations including OTC pain control, IS and PT recommended.  Shortness of breath - improved with PT. Likely secondary to rib pain, deconditioning. Asthma seems well controlled with PRN SABA and not affected by maintenance inhaler --Continue sessions with [facility] --Incentive spirometry three times a day --Continue tylenol four times a day --Recommend ibuprofen 200 mg in the morning and evening with food as needed. Use sparingly.  Aspiration --Discussed and provided handout for aspiration precautions   Health Maintenance Immunization History  Administered Date(s) Administered   Moderna Sars-Covid-2 Vaccination 08/22/2019, 09/24/2019   CT Lung Screen - not qualified  No orders of the defined types were placed in this encounter. No orders of the defined types were placed in this encounter.   No follow-ups on file.  I have spent a total time of***-minutes on the day of the appointment including chart review, data review, collecting history, coordinating care and discussing medical diagnosis and plan with the patient/family. Past medical history, allergies, medications were reviewed. Pertinent imaging, labs and tests included in this note have been reviewed and interpreted independently by me.  Kerilyn Cortner Mechele Collin, MD [facility] 09/12/2023 8:17 AM

## 2024-03-17 ENCOUNTER — Telehealth: Payer: Self-pay | Admitting: Gastroenterology

## 2024-03-17 MED ORDER — CHOLESTYRAMINE 4 G PO PACK: 4.0000 g | PACK | Freq: Every day | ORAL | 0 refills | Status: DC

## 2024-03-17 NOTE — Telephone Encounter (Signed)
 Patient needs an appointment for more refills. Some was sent

## 2024-03-17 NOTE — Addendum Note (Signed)
 Addended by: KATHIE LYLE BRAVO on: 03/17/2024 09:40 AM   Modules accepted: Orders

## 2024-03-17 NOTE — Addendum Note (Signed)
 Addended by: KATHIE LYLE BRAVO on: 03/17/2024 09:39 AM   Modules accepted: Orders

## 2024-03-17 NOTE — Telephone Encounter (Signed)
 Received call from patient, Patient states she would not be able to pick cholestyramine  medication up from [facility], would like medication sent to [facility] on [address]. Please review and advise  Thank you

## 2024-04-03 ENCOUNTER — Other Ambulatory Visit: Payer: Self-pay

## 2024-04-04 ENCOUNTER — Ambulatory Visit

## 2024-04-04 VITALS — BP 140/86 | HR 75 | Ht <= 58 in | Wt 138.4 lb

## 2024-04-04 DIAGNOSIS — I10 Essential (primary) hypertension: Secondary | ICD-10-CM | POA: Insufficient documentation

## 2024-04-04 NOTE — Assessment & Plan Note (Signed)
 Mildly elevated blood pressures. Considering her advanced age, target below 140/80 mmHg. Will hold off on initiating any blood pressure lowering medications at this time. Continue to monitor. Will defer this further to PCP at this time as a follow-up with us  is going to be on an as-needed basis.SABRA

## 2024-04-04 NOTE — Progress Notes (Signed)
 Cardiology Consultation:    Date:  04/04/2024   ID:  Chelsea Gregory, Chelsea Gregory 01-08-1946, MRN [id number]  PCP:  Chelsea Eleanor Rung, NP  Cardiologist:  Alean SAUNDERS Thijs Brunton, MD   Referring MD: Chelsea Eleanor PARAS*   No chief complaint on file.    ASSESSMENT AND PLAN:   [name] 78 year old woman with chronic dyspnea, abnormal stress test but follow-up coronary angiogram 03/20/2023 with normal coronaries, normal LV function on echocardiogram August 2024, normal LVEDP, obstructive sleep apnea-uses CPAP regularly, diverticulosis, chronic back and neck pain, GERD, statin intolerance [Crestor caused muscle aches and similar symptoms with simvastatin ].  Overall from cardiac standpoint doing well here for a routine follow-up visit. With relatively well-controlled lipid panel levels and no significant coronary atherosclerosis, okay to hold off aspirin  and statins from cardiac standpoint.  Problem List Items Addressed This Visit     Essential (primary) hypertension - Primary   Mildly elevated blood pressures. Considering her advanced age, target below 140/80 mmHg. Will hold off on initiating any blood pressure lowering medications at this time. Continue to monitor. Will defer this further to PCP at this time as a follow-up with us  is going to be on an as-needed basis..       Relevant Orders   EKG 12-Lead   Return to clinic tentatively on an as-needed basis. Continue to follow-up with her PCP.  History of Present Illness:    Chelsea Gregory is a 78 y.o. female who is being seen today for follow-up visit. PCP is Brown-Patram, Melissa J*. Last visit with me in the office was 04/10/2023.  Has history of chronic dyspnea on exertion and sustained a fall in August 2024 after which she was continuing to have chest pains associated with shortness of breath this prompted a stress test with nuclear imaging August 2024 showed anterior ischemia.  Coronary angiogram was scheduled  however with ongoing symptoms she was admitted and Limited echocardiogram 03/20/2023 noted EF 55 to 60% with asymmetric thickening of basal septal segment with regional wall motion abnormality of the mid anterior wall segment.   Subsequently underwent cardiac cath 03/20/2023 that showed normal coronaries, normal hemodynamics LVEDP 14 mmHg and wall motion appeared normal on left ventriculogram done during cath.. CT of the chest ruled out pulm embolism.  Subacute fracture of left anterior 4th and 5th ribs was demonstrated unchanged from prior imaging. She also has history of obstructive sleep apnea-uses CPAP regularly, diverticulosis, chronic back and neck pain, GERD, statin intolerance [Crestor caused muscle aches and similar symptoms with simvastatin ]  Here for the visit today accompanied by her husband. Able to do her activities at home without any significant limitations.  Does get tired at times. No chest pain Does get short of breath at times depending on the weather when it is humid and hot. No orthopnea.  No pedal edema. No syncopal or near syncopal episodes.  She does have chronic symptoms of vertigo and headache typically for Singh in the morning and improves gradually after Tylenol .  Blood work from 10/22/2023 noted sodium 141, potassium 4.4 BUN 17, creatinine 0.72, eGFR 85.  Normal transaminases and alkaline phosphatase. Hemoglobin A1c 5.3 Lipid panel total cholesterol 168, triglycerides 227, HDL 55 and LDL 81. Thyroid  panel normal with TSH 2.021. CBC with hemoglobin 12.9 and hematocrit 39.5, platelets 298 and WBC 7.8. Past Medical History:  Diagnosis Date   Abdominal pain of unknown cause 10/21/2015   Last Assessment & Plan:   Relevant Hx:  Course:  Daily Update:  Today's Plan:still following with Dr. Delphia for this and she has pending FU with him for it as well and she indicates that she is still having discomfort and on exam was having some today though maybe not as bad, reviewed  with her that would not have her take more abx for this though.     Electronically signed by: Eleanor Gregory Bro   Abdominal pain, right lower quadrant 04/01/2014   Abnormal cardiovascular stress test 03/09/2023   Acute infection of nasal sinus 04/01/2014   Acute on chronic diastolic (congestive) heart failure (HCC) 03/20/2023   Anxiety 04/01/2014   Apnea, sleep 04/01/2014   Arthritis    Knees   Arthritis, degenerative 04/01/2014   Asthma, mild intermittent 10/08/2015   Last Assessment & Plan:   Relevant Hx:  Course:  Daily Update:  Today's Plan:this is stable for her at this time and will follow along for her     Electronically signed by: Eleanor Gregory Lady, NP  10/28/15 1205     Avitaminosis D 04/01/2014   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   Chronic kidney disease    kidney infection post colon surgery on Macrodantin    Chronic rhinitis 04/01/2014   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   Last Assessment & Plan:   Relevant Hx:  Course:  Daily Update:  Today's Plan:this is going to be checked for her fasting today on current dose of med     Electronically signed by: Eleanor Gregory Lady, NP  10/28/15 1256     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   DD (diverticular disease) 04/01/2014   Overview:   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     Degeneration of intervertebral disc of lumbosacral region 04/01/2014   Overview:   Taking shot therapy with pain center     Diaphragmatic hernia 04/01/2014   Diverticular disease of left colon    Dyspnea on exertion over the past month and a half 03/02/2023   Essential (primary) hypertension 10/08/2015   Last Assessment & Plan:   Relevant Hx:  Course:  Daily Update:  Today's Plan:this is stable for her at this time and will follow her along     Electronically signed by: Eleanor Gregory Lady, NP  10/28/15 1205     Fatigue 03/02/2023   Gastro-esophageal reflux disease without esophagitis 10/08/2015   Last Assessment &  Plan:   Relevant Hx:  Course:  Daily Update:  Today's Plan:this is stable for her at this time and will follow along for her      Electronically signed by: Eleanor Gregory Lady, NP  10/28/15 1211     Generalized OA 10/08/2015   Last Assessment & Plan:   Relevant Hx:  Course:  Daily Update:  Today's Plan:she has chronic pain which I belive is just that chronic pain from her OA but she has had multiple tick bites in the last 2 mnths and we did discuss her having labs which were drawn for her today for tick borne illness .     Electronically signed by: Eleanor Gregory Lady, NP  01/11/16 1335     Gonalgia 04/01/2014   Headache, migraine 04/01/2014   Lumbar canal stenosis 08/14/2011   Last Assessment & Plan:   Relevant Hx:  Course:  Daily Update:  Today's Plan:she again is following with ortho for this and she is to have review with him in June for discussion of surgery     Electronically signed by: Eleanor Gregory Lady, NP  10/28/15 1300  Lumbar foraminal stenosis 03/14/2016   Malaise and fatigue 10/08/2015   Last Assessment & Plan:   Relevant Hx:  Course:  Daily Update:  Today's Plan:update the labs as we discussed and she is having some increased anxiety worrying over her husband who is having some health issues right now and that could be an aggravating factor for her as well and she needs to address that with him and her concerns     Electronically signed by: Eleanor Merlynn Lady, NP  06/20/1   MRSA colonization 2009   Muscle ache 04/01/2014   Neuromuscular disorder (HCC)    Cervical disc   Osteopenia 04/01/2014   Seasonal allergies    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    Past Surgical History:  Procedure Laterality Date   ABDOMINAL HYSTERECTOMY  2006   ANTERIOR LAT LUMBAR FUSION Left 03/14/2016   Procedure: Extreme Lumbar Interbody Fusion Left Lumbar Two-Three,Lumbar Three-four,Lumbar Four-Five with Pedicle Screws;  Surgeon:  Victory Gunnels, MD;  Location: [facility];  Service: Neurosurgery;  Laterality: Left;  left approach   BACK SURGERY     CERVICAL DISCECTOMY  2010   CHOLECYSTECTOMY  2000   COLON SURGERY  2009   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, aso  had left ovary removed    Colosotmy  2009   COLOSTOMY CLOSURE  2010   EYE SURGERY     Implant to tear duct   HERNIA REPAIR  12/2009   Right   HERNIA REPAIR  2012   Left   JOINT REPLACEMENT Left 2015   LEFT HEART CATH AND CORONARY ANGIOGRAPHY N/A 03/20/2023   Procedure: LEFT HEART CATH AND CORONARY ANGIOGRAPHY;  Surgeon: Dann Candyce RAMAN, MD;  Location: [facility] INVASIVE CV LAB;  Service: Cardiovascular;  Laterality: N/A;   LUMBAR LAMINECTOMY/DECOMPRESSION MICRODISCECTOMY  08/14/2011   Procedure: LUMBAR LAMINECTOMY/DECOMPRESSION MICRODISCECTOMY;  Surgeon: Victory DELENA Gunnels, MD;  Location: [facility];  Service: Neurosurgery;  Laterality: Left;  Left Lumbar three-four, Bilateral Lumbar four-five decompressive lumbar laminectomy   LUMBAR PERCUTANEOUS PEDICLE SCREW 3 LEVEL Left 03/14/2016   Procedure: LUMBAR PERCUTANEOUS PEDICLE SCREW 3 LEVEL;  Surgeon: Victory Gunnels, MD;  Location: [facility];  Service: Neurosurgery;  Laterality: Left;   TUBAL LIGATION  1968    Current Medications: Current Meds  Medication Sig   acetaminophen  (TYLENOL  8 HOUR ARTHRITIS PAIN) 650 MG CR tablet Take 2,600 mg by mouth daily.   ALPRAZolam  (XANAX ) 0.5 MG tablet Take 0.5-1 mg by mouth at bedtime as needed for anxiety.   butalbital -acetaminophen -caffeine  (FIORICET) 50-325-40 MG tablet Take 1 tablet by mouth as needed for headache.   Cholecalciferol  (VITAMIN D3) 3000 units TABS Take 3,000 Units by mouth daily.    cholestyramine  (QUESTRAN ) 4 g packet Take 1 packet (4 g total) by mouth daily. Please call [phone] to schedule an office visit for more refills TAKE 2 HOURS BEFORE  OR AFTER ALL OTHER MEDICATIONS   estradiol (ESTRACE) 0.1 MG/GM vaginal cream Place 1 Applicatorful vaginally 3 (three)  times a week.   fexofenadine (ALLEGRA) 180 MG tablet Take 180 mg by mouth daily.   fluticasone  (FLONASE ) 50 MCG/ACT nasal spray Place 2 sprays into both nostrils.   loperamide  (IMODIUM  A-D) 2 MG tablet Take 1-3 tablets by mouth as directed.   meloxicam  (MOBIC ) 7.5 MG tablet Take 7.5-15 mg by mouth daily. (Patient taking differently: Take 7.5-15 mg by mouth 2 (two) times daily.)   methenamine (HIPREX) 1 g tablet Take 1 g by mouth 2 (two)  times daily with a meal.   Misc Natural Products (OSTEO BI-FLEX TRIPLE STRENGTH PO) Take 1 tablet by mouth daily.   montelukast  (SINGULAIR ) 10 MG tablet Take 10 mg by mouth daily.   Multiple Vitamins-Minerals (MULTIVITAMINS THER. W/MINERALS) TABS Take 1 tablet by mouth daily.       Allergies:   Sulfa antibiotics, Cortizone-10 [hydrocortisone], Elemental sulfur, Celebrex [celecoxib], Ciprofloxacin, Levofloxacin, Metronidazole, and Promethazine hcl   Social History   Socioeconomic History   Marital status: Married    Spouse name: Not on file   Number of children: 2   Years of education: Not on file   Highest education level: Not on file  Occupational History   Not on file  Tobacco Use   Smoking status: Never    Passive exposure: Never   Smokeless tobacco: Never  Vaping Use   Vaping status: Never Used  Substance and Sexual Activity   Alcohol use: No   Drug use: No   Sexual activity: Not on file  Other Topics Concern   Not on file  Social History Narrative   Not on file   Social Drivers of Health   Financial Resource Strain: Low Risk  (03/29/2022)   Received from [facility], [facility] visits prior to 09/23/2022.   Overall Financial Resource Strain (CARDIA)    Difficulty of Paying Living Expenses: Not hard at all  Food Insecurity: No Food Insecurity (03/19/2023)   Gregory Vital Sign    Worried About Running Out of Food in the Last Year: Never true    Ran Out of Food in the Last Year: Never true  Transportation Needs: No  Transportation Needs (03/19/2023)   PRAPARE - Administrator, Civil Service (Medical): No    Lack of Transportation (Non-Medical): No  Physical Activity: Unknown (03/29/2022)   Received from [facility], [facility] visits prior to 09/23/2022.   Exercise Vital Sign    On average, how many days per week do you engage in moderate to strenuous exercise (like a brisk walk)?: Patient declined    Minutes of Exercise per Session: Not on file  Stress: No Stress Concern Present (03/29/2022)   Received from [facility], [facility] visits prior to 09/23/2022.   [employer] of Occupational Health - Occupational Stress Questionnaire    Feeling of Stress : Only a little  Social Connections: Socially Integrated (03/29/2022)   Received from [facility], [facility] visits prior to 09/23/2022.   Social Connection and Isolation Panel    In a typical week, how many times do you talk on the phone with family, friends, or neighbors?: More than three times a week    How often do you get together with friends or relatives?: Three times a week    How often do you attend church or religious services?: More than 4 times per year    Do you belong to any clubs or organizations such as church groups, unions, fraternal or athletic groups, or school groups?: Yes    How often do you attend meetings of the clubs or organizations you belong to?: More than 4 times per year    Are you married, widowed, divorced, separated, never married, or living with a partner?: Married     Family History: The patient's family history is negative for Anesthesia problems and Colon cancer. ROS:   Please see the history of present illness.    All 14 point review  of systems negative except as described per history of present illness.  EKGs/Labs/Other Studies Reviewed:    The following studies were reviewed today:   EKG:       Recent Labs: No results found  for requested labs within last 365 days.  Recent Lipid Panel    Component Value Date/Time   CHOL 126 03/20/2023 0635   TRIG 105 03/20/2023 0635   HDL 63 03/20/2023 0635   CHOLHDL 2.0 03/20/2023 0635   VLDL 21 03/20/2023 0635   LDLCALC 42 03/20/2023 0635    Physical Exam:    VS:  BP (!) 140/86 (BP Location: Left Arm, Patient Position: Sitting, Cuff Size: Normal)   Pulse 75   Ht 4' 9 (1.448 m)   Wt 138 lb 6.4 oz (62.8 kg)   SpO2 96%   BMI 29.95 kg/m     Wt Readings from Last 3 Encounters:  04/04/24 138 lb 6.4 oz (62.8 kg)  09/12/23 147 lb 11.2 oz (67 kg)  08/15/23 144 lb 14.4 oz (65.7 kg)     GENERAL:  Well nourished, well developed in no acute distress NECK: No JVD; No carotid bruits CARDIAC: RRR, S1 and S2 present, no murmurs, no rubs, no gallops CHEST:  Clear to auscultation without rales, wheezing or rhonchi  Extremities: No pitting pedal edema. Pulses bilaterally symmetric with radial 2+ and dorsalis pedis 2+ NEUROLOGIC:  Alert and oriented x 3  Medication Adjustments/Labs and Tests Ordered: Current medicines are reviewed at length with the patient today.  Concerns regarding medicines are outlined above.  Orders Placed This Encounter  Procedures   EKG 12-Lead   No orders of the defined types were placed in this encounter.   Signed, Alean jess Kobus, MD, MPH, [facility]. 04/04/2024 3:59 PM    [facility]

## 2024-04-04 NOTE — Patient Instructions (Signed)
 Medication Instructions:  Your physician recommends that you continue on your current medications as directed. Please refer to the Current Medication list given to you today.  *If you need a refill on your cardiac medications before your next appointment, please call your pharmacy*  Lab Work: None If you have labs (blood work) drawn today and your tests are completely normal, you will receive your results only by: MyChart Message (if you have MyChart) OR A paper copy in the mail If you have any lab test that is abnormal or we need to change your treatment, we will call you to review the results.  Testing/Procedures: None  Follow-Up: At [facility], you and your health needs are our priority.  As part of our continuing mission to provide you with exceptional heart care, our providers are all part of one team.  This team includes your primary Cardiologist (physician) and Advanced Practice Providers or APPs (Physician Assistants and Nurse Practitioners) who all work together to provide you with the care you need, when you need it.  Your next appointment:   Follow up as needed  Provider:   Huntley Dec, MD    We recommend signing up for the patient portal called "MyChart".  Sign up information is provided on this After Visit Summary.  MyChart is used to connect with patients for Virtual Visits (Telemedicine).  Patients are able to view lab/test results, encounter notes, upcoming appointments, etc.  Non-urgent messages can be sent to your provider as well.   To learn more about what you can do with MyChart, go to [email or web address].   Other Instructions None

## 2024-04-09 NOTE — Progress Notes (Unsigned)
 04/10/2024 Chelsea Gregory [id number] 03/17/1946  Referring provider: Benson Eleanor PARAS* Primary GI doctor: Dr. 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 Delphia Managed with bile acid sequestrant and loperamide . Morning episodes with urgency. Magnesium may contribute to loose stools. No significant symptoms or recent antibiotic use. Discussed dietary triggers and magnesium's role. - Refill Questran  prescription. - Discontinue magnesium supplement. - Provide FODMAP diet information. - Prescribe Imodium  as needed, max 16 mg/day. - Send Imodium  prescription for mail order, 90-day supply. - Instruct to call if symptoms worsen or additional symptoms occur.  History of diverticular abscess Sigmoid resection 2009 Add on fiber  GERD -Lifestyle changes discussed, avoid NSAIDS, ETOH, hand out given to the patient - no symptoms at this time  Umblical hernia Being monitored Discuss symptoms of strangulation  CAD 03/20/2023 cardiac catheterization normal coronaries, normal LV function on echocardiogram August 2024  Patient Care Team: Benson Eleanor Rung, NP as PCP - General (Internal Medicine) [name], Alean SAUNDERS, MD as PCP - Cardiology (Cardiology)  HISTORY OF PRESENT ILLNESS: 78 y.o. female with a past medical history listed below presents for evaluation of diarrhea and need for refill.   Last seen in the office 02/07/2022 by Dr. Charlanne for diarrhea, resolved with colestyramine 4 g once daily.  Discussed the use of AI scribe software for clinical note transcription with the patient, who gave verbal consent to proceed.  History of Present Illness   Chelsea Gregory is an 78 year old female who presents with diarrhea.  She has ongoing diarrhea, previously managed with Questran , which she takes once daily in the evening. This has been effective, but she occasionally requires Imodium , approximately once a  week, primarily in the mornings when she experiences loose stools. She is unsure of specific triggers but notes it sometimes occurs upon waking.  She has a history of taking multiple medications and managing her timing to avoid interactions. She added Hiprex to her regimen two to three years ago for recurrent UTIs, which has been effective in preventing infections. She was previously on antibiotics frequently for UTIs but has not needed them since starting Hiprex.  No significant bloating, abdominal pain, dark or bloody stools, or weight loss. She has experienced fecal urgency and incontinence twice in the past two weeks, describing it as sudden and unexpected. She recently started taking magnesium supplements a few months ago, primarily for bone and joint health, but is considering discontinuing it due to its potential contribution to her diarrhea. She has not been on antibiotics in the past six months and denies taking any over-the-counter supplements that could contribute to her symptoms.  She has a history of a childhood hernia, which occasionally causes soreness but is not currently problematic.      She  reports that she has never smoked. She has never been exposed to tobacco smoke. She has never used smokeless tobacco. She reports that she does not drink alcohol and does not use drugs.  RELEVANT GI HISTORY, IMAGING AND LABS: Results          CBC    Component Value Date/Time   WBC 8.6 03/20/2023 0635   RBC 3.99 03/20/2023 0635   HGB 12.6 03/20/2023 0635   HCT 37.5 03/20/2023 0635   PLT 269 03/20/2023 0635   MCV 94.0 03/20/2023 0635   MCH 31.6 03/20/2023 0635   MCHC 33.6 03/20/2023 0635   RDW 12.6 03/20/2023 0635   LYMPHSABS 2.5 03/07/2016 1615  MONOABS 0.4 03/07/2016 1615   EOSABS 0.1 03/07/2016 1615   BASOSABS 0.0 03/07/2016 1615   No results for input(s): HGB in the last 8760 hours.  CMP     Component Value Date/Time   NA 137 03/20/2023 0635   K 3.7 03/20/2023 0635    CL 108 03/20/2023 0635   CO2 21 (L) 03/20/2023 0635   GLUCOSE 94 03/20/2023 0635   BUN 13 03/20/2023 0635   CREATININE 0.92 03/20/2023 0635   CALCIUM  8.5 (L) 03/20/2023 0635   GFRNONAA >60 03/20/2023 0635   GFRAA >60 03/07/2016 1615       No data to display            Current Medications:    Current Outpatient Medications (Cardiovascular):    cholestyramine  (QUESTRAN ) 4 g packet, Take 1 packet (4 g total) by mouth daily. TAKE 2 HOURS BEFORE  OR AFTER ALL OTHER MEDICATIONS  Current Outpatient Medications (Respiratory):    fexofenadine (ALLEGRA) 180 MG tablet, Take 180 mg by mouth daily.   fluticasone  (FLONASE ) 50 MCG/ACT nasal spray, Place 2 sprays into both nostrils.   montelukast  (SINGULAIR ) 10 MG tablet, Take 10 mg by mouth daily.  Current Outpatient Medications (Analgesics):    acetaminophen  (TYLENOL  8 HOUR ARTHRITIS PAIN) 650 MG CR tablet, Take 2,600 mg by mouth daily.   butalbital -acetaminophen -caffeine  (FIORICET) 50-325-40 MG tablet, Take 1 tablet by mouth as needed for headache.   meloxicam  (MOBIC ) 7.5 MG tablet, Take 7.5-15 mg by mouth daily. (Patient taking differently: Take 7.5-15 mg by mouth 2 (two) times daily.)   Current Outpatient Medications (Other):    ALPRAZolam  (XANAX ) 0.5 MG tablet, Take 0.5-1 mg by mouth at bedtime as needed for anxiety.   Cholecalciferol  (VITAMIN D3) 3000 units TABS, Take 3,000 Units by mouth daily.    estradiol (ESTRACE) 0.1 MG/GM vaginal cream, Place 1 Applicatorful vaginally 3 (three) times a week.   loperamide  (IMODIUM  A-D) 2 MG tablet, Take 1-3 tablets by mouth as directed.   loperamide  (IMODIUM ) 2 MG capsule, Take 1 capsule (2 mg total) by mouth as needed for diarrhea or loose stools.   methenamine (HIPREX) 1 g tablet, Take 1 g by mouth 2 (two) times daily with a meal.   Misc Natural Products (OSTEO BI-FLEX TRIPLE STRENGTH PO), Take 1 tablet by mouth daily.   Multiple Vitamins-Minerals (MULTIVITAMINS THER. W/MINERALS) TABS, Take  1 tablet by mouth daily.    Medical History:  Past Medical History:  Diagnosis Date   Abdominal pain of unknown cause 10/21/2015   Last Assessment & Plan:   Relevant Hx:  Course:  Daily Update:  Today's Plan:still following with Dr. Delphia for this and she has pending FU with him for it as well and she indicates that she is still having discomfort and on exam was having some today though maybe not as bad, reviewed with her that would not have her take more abx for this though.     Electronically signed by: Eleanor Rung Bro   Abdominal pain, right lower quadrant 04/01/2014   Abnormal cardiovascular stress test 03/09/2023   Acute infection of nasal sinus 04/01/2014   Acute on chronic diastolic (congestive) heart failure (HCC) 03/20/2023   Anxiety 04/01/2014   Apnea, sleep 04/01/2014   Arthritis    Knees   Arthritis, degenerative 04/01/2014   Asthma, mild intermittent 10/08/2015   Last Assessment & Plan:   Relevant Hx:  Course:  Daily Update:  Today's Plan:this is stable for her at this time and will  follow along for her     Electronically signed by: Eleanor Merlynn Lady, NP  10/28/15 1205     Avitaminosis D 04/01/2014   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   Chronic kidney disease    kidney infection post colon surgery on Macrodantin    Chronic rhinitis 04/01/2014   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   Last Assessment & Plan:   Relevant Hx:  Course:  Daily Update:  Today's Plan:this is going to be checked for her fasting today on current dose of med     Electronically signed by: Eleanor Merlynn Lady, NP  10/28/15 1256     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   DD (diverticular disease) 04/01/2014   Overview:   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     Degeneration of intervertebral disc of lumbosacral region 04/01/2014   Overview:   Taking shot therapy with pain center     Diaphragmatic hernia 04/01/2014   Diverticular disease of left colon    Dyspnea on  exertion over the past month and a half 03/02/2023   Essential (primary) hypertension 10/08/2015   Last Assessment & Plan:   Relevant Hx:  Course:  Daily Update:  Today's Plan:this is stable for her at this time and will follow her along     Electronically signed by: Eleanor Merlynn Lady, NP  10/28/15 1205     Fatigue 03/02/2023   Gastro-esophageal reflux disease without esophagitis 10/08/2015   Last Assessment & Plan:   Relevant Hx:  Course:  Daily Update:  Today's Plan:this is stable for her at this time and will follow along for her      Electronically signed by: Eleanor Merlynn Lady, NP  10/28/15 1211     Generalized OA 10/08/2015   Last Assessment & Plan:   Relevant Hx:  Course:  Daily Update:  Today's Plan:she has chronic pain which I belive is just that chronic pain from her OA but she has had multiple tick bites in the last 2 mnths and we did discuss her having labs which were drawn for her today for tick borne illness .     Electronically signed by: Eleanor Merlynn Lady, NP  01/11/16 1335     Gonalgia 04/01/2014   Headache, migraine 04/01/2014   Lumbar canal stenosis 08/14/2011   Last Assessment & Plan:   Relevant Hx:  Course:  Daily Update:  Today's Plan:she again is following with ortho for this and she is to have review with him in June for discussion of surgery     Electronically signed by: Eleanor Merlynn Lady, NP  10/28/15 1300     Lumbar foraminal stenosis 03/14/2016   Malaise and fatigue 10/08/2015   Last Assessment & Plan:   Relevant Hx:  Course:  Daily Update:  Today's Plan:update the labs as we discussed and she is having some increased anxiety worrying over her husband who is having some health issues right now and that could be an aggravating factor for her as well and she needs to address that with him and her concerns     Electronically signed by: Eleanor Merlynn Lady, NP  06/20/1   MRSA colonization 2009   Muscle ache 04/01/2014   Neuromuscular  disorder (HCC)    Cervical disc   Osteopenia 04/01/2014   Seasonal allergies    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   Ciprofloxacin Rash   Levofloxacin Nausea And Vomiting   Metronidazole Nausea Only    Severe rash   Promethazine Hcl Other (See Comments)    Right muscle spasm.     Surgical History:  She  has a past surgical history that includes Tubal ligation (1968); Cholecystectomy (2000); Abdominal hysterectomy (2006); Colon surgery (2009); Colosotmy (2009); Colostomy closure (2010); Cervical discectomy (2010); Hernia repair (12/2009); Hernia repair (2012); Eye surgery; Lumbar laminectomy/decompression microdiscectomy (08/14/2011); Joint replacement (Left, 2015); Back surgery; Anterior lat lumbar fusion (Left, 03/14/2016); Lumbar percutaneous pedicle screw 3 level (Left, 03/14/2016); and LEFT HEART CATH AND CORONARY ANGIOGRAPHY (N/A, 03/20/2023). Family History:  Her family history is not on file.  REVIEW OF SYSTEMS  : All other systems reviewed and negative except where noted in the History of Present Illness.  PHYSICAL EXAM: BP (!) 126/90   Pulse 84   Ht 4' 9 (1.448 m)   Wt 137 lb 8 oz (62.4 kg)   BMI 29.75 kg/m  Physical Exam   GENERAL APPEARANCE: Well nourished, in no apparent distress. HEENT: No cervical lymphadenopathy, unremarkable thyroid , sclerae anicteric, conjunctiva pink. RESPIRATORY: Respiratory effort normal, breath sounds equal bilaterally without rales, rhonchi, or wheezing. CARDIO: Regular rate and rhythm with no murmurs, rubs, or gallops, peripheral pulses intact. ABDOMEN: Soft, non-distended, active bowel sounds in all four quadrants, tenderness to palpation, no  rebound, no mass appreciated. RECTAL: Declines. MUSCULOSKELETAL: Full range of motion, normal gait, without edema. SKIN: Dry, intact without rashes or lesions. No jaundice. NEURO: Alert, oriented, no focal deficits. PSYCH: Cooperative, normal mood and affect.      Chelsea JONELLE Coombs, PA-C 9:47 AM

## 2024-04-10 ENCOUNTER — Ambulatory Visit: Admitting: Physician Assistant

## 2024-04-10 ENCOUNTER — Encounter: Payer: Self-pay | Admitting: Physician Assistant

## 2024-04-10 VITALS — BP 126/90 | HR 84 | Ht <= 58 in | Wt 137.5 lb

## 2024-04-10 DIAGNOSIS — K219 Gastro-esophageal reflux disease without esophagitis: Secondary | ICD-10-CM | POA: Diagnosis not present

## 2024-04-10 DIAGNOSIS — K580 Irritable bowel syndrome with diarrhea: Secondary | ICD-10-CM | POA: Diagnosis not present

## 2024-04-10 DIAGNOSIS — K5730 Diverticulosis of large intestine without perforation or abscess without bleeding: Secondary | ICD-10-CM

## 2024-04-10 DIAGNOSIS — Z8719 Personal history of other diseases of the digestive system: Secondary | ICD-10-CM | POA: Diagnosis not present

## 2024-04-10 MED ORDER — CHOLESTYRAMINE 4 G PO PACK: 4.0000 g | PACK | Freq: Every day | ORAL | 3 refills | Status: AC

## 2024-04-10 MED ORDER — LOPERAMIDE HCL 2 MG PO CAPS: 2.0000 mg | ORAL_CAPSULE | ORAL | 3 refills | Status: DC | PRN

## 2024-04-10 NOTE — Patient Instructions (Addendum)
 Continue Questran  once daily Stop magnesium - Can take imodium / loperamide  2-4 mg initially, then 2 mg after each unformed stool for =2 days, with a maximum of 16 mg/day.   - If loperamide  is not working, you could try bismuth salicylate (Pepto-Bismol) 30 mL or two tablets every 30 minutes for eight doses. Pepto-Bismol may make your stools black.   Go to the ER if any severe abdominal pain, fever, or weakness   FODMAP stands for fermentable oligo-, di-, mono-saccharides and polyols (1). These are the scientific terms used to classify groups of carbs that are difficult for our body to digest and that are notorious for triggering digestive symptoms like bloating, gas, loose stools and stomach pain.   You can try low FODMAP diet  - start with eliminating just one column at a time that you feel may be a trigger for you. - the table at the very bottom contains foods that are low in FODMAPs   Sometimes trying to eliminate the FODMAP's from your diet is difficult or tricky, if you are stuggling with trying to do the elimination diet you can try an enzyme.  There is a food enzymes that you sprinkle in or on your food that helps break down the FODMAP. You can read more about the enzyme by going to this site: https://fodzyme.com/  Small intestinal bacterial overgrowth (SIBO) occurs when there is an abnormal increase in the overall bacterial population in the small intestine -- particularly types of bacteria not commonly found in that part of the digestive tract. Small intestinal bacterial overgrowth (SIBO) commonly results when a circumstance -- such as surgery or disease -- slows the passage of food and waste products in the digestive tract, creating a breeding ground for bacteria.  Signs and symptoms of SIBO often include: Loss of appetite Abdominal pain Nausea Bloating An uncomfortable feeling of fullness after eating Diarrhea or constipation, depending on the type of gas produced  What  foods trigger SIBO? While foods aren't the original cause of SIBO, certain foods do encourage the overgrowth of the wrong bacteria in your small intestine. If you're feeding them their favorite foods, they're going to grow more, and that will trigger more of your SIBO symptoms. By the same token, you can help reduce the overgrowth by starving the problematic bacteria of their favorite foods. This strategy has led to a number of proposed SIBO eating plans. The plans vary, and so do individual results. But in general, they tend to recommend limiting carbohydrates.  These include: Sugars and sweeteners. Fruits and starchy vegetables. Dairy products. Grains.  There is a test for this we can do called a breath test, if you are positive we will treat you with an antibiotic to see if it helps.  Your symptoms are very suspicious for this condition, as discussed, we will start you on an antibiotic to see if this helps.   What Is Microscopic Colitis? Microscopic colitis is a condition that causes chronic, watery diarrhea. Unlike other types of colitis (like ulcerative colitis or Crohn's disease), the colon (large intestine) appears normal during a colonoscopy. The inflammation can only be seen under a microscope--hence the name.  There are two main types:  Lymphocytic colitis - increased white blood cells (lymphocytes) in the colon lining Collagenous colitis - thickened layer of collagen (a protein) in the colon lining  Common Symptoms  Ongoing watery diarrhea (often multiple times a day) Urgency to have a bowel movement Abdominal pain or cramping Bloating or gas Fatigue  Weight loss (less common)  Causes and Risk Factors The exact cause isn't fully known, but possible contributing factors include:  Immune system reactions  Medications, such as: NSAIDs (e.g., ibuprofen) Proton pump inhibitors (e.g., omeprazole) SSRIs (e.g., sertraline) Smoking Older age (most common in people over  5) Female sex (more common in women)   How Is It Diagnosed?  Colonoscopy: usually appears normal  Biopsy (tiny tissue sample from the colon) is needed to see inflammation under a microscope  Treatment Options Treatment depends on how severe your symptoms are:  Lifestyle & Diet Changes  Avoid trigger foods (e.g., caffeine , dairy, fatty foods) Quit smoking Reduce alcohol Stay hydrated  Medications  Anti-diarrheal meds (like loperamide /Imodium ) Budesonide (a corticosteroid with fewer side effects, often the first choice for moderate-to-severe cases) Bismuth subsalicylate (e.g., Pepto-Bismol) Stopping or switching medications that may be triggering the condition   What's the Outlook?  Many people improve with treatment. The condition may come and go. It's not associated with colon cancer. Regular follow-up helps keep symptoms under control.

## 2024-04-14 ENCOUNTER — Telehealth: Payer: Self-pay | Admitting: Physician Assistant

## 2024-04-14 NOTE — Telephone Encounter (Signed)
 Patient requesting cholestyramine   prescription sent into [facility]. Please advise.

## 2024-04-16 NOTE — Telephone Encounter (Signed)
 Rx was sent to [facility] on 9/18.  I tried to call Chelsea Gregory but I was unable to leave a message.  Her phone just rang.  No vml

## 2024-04-22 ENCOUNTER — Encounter (HOSPITAL_BASED_OUTPATIENT_CLINIC_OR_DEPARTMENT_OTHER): Payer: Self-pay | Admitting: Pulmonary Disease

## 2024-04-22 ENCOUNTER — Ambulatory Visit (INDEPENDENT_AMBULATORY_CARE_PROVIDER_SITE_OTHER): Admitting: Pulmonary Disease

## 2024-04-22 VITALS — BP 137/86 | HR 86 | Ht <= 58 in | Wt 137.7 lb

## 2024-04-22 DIAGNOSIS — G4733 Obstructive sleep apnea (adult) (pediatric): Secondary | ICD-10-CM | POA: Diagnosis not present

## 2024-04-22 DIAGNOSIS — R0602 Shortness of breath: Secondary | ICD-10-CM | POA: Diagnosis not present

## 2024-04-22 NOTE — Progress Notes (Signed)
 Subjective:   PATIENT ID: Chelsea Gregory GENDER: female DOB: 1945/08/14, MRN: [id number]  Chief Complaint  Patient presents with   Follow-up    Aspiration into airway    Reason for Visit: Follow-up for shortness of breath  Chelsea Gregory is a 78 year old female never smoker with OSA, asthma, allergic rhinitis who presents for follow-up shortness of breath.  Initial consult She was previously seen by Dr. Mardee for sleep apnea on BiPAP, initially diagnosed 10-12 years ago and asthma overall well-controlled and has been on as needed albuterol and allergy shots every 3-4 months.  She began having worsening shortness of breath in fall 2024 associated with chest tightness that is constant and worsened with activity. Improved with lying down but still some symptoms. Of note she had 2 cracked ribs after a fall in July and seen in ED with CTA 02/27/23 - neg for pulmonary embolism. Mild dependent and lower lobe atelectasis, mild chronic scarring/atelectasis of RML. No effusion.   She has been seen by her PCP she has been compliant with Advair 100-50 but no improvement. Denies wheezing or coughing. Has been walking in house and housework but its difficult for her. No longer regular walking outside of the home. Uses incentive spirometry up to 1400 cc. Prescribed xanax  in the ED but does not use it. Uses tylenol  for arthritis 650 mg four times a day. Unable to take ibuprofen with hx of stomach issues.   09/12/23 Since our last visit, she was evaluated for shortness of breath. Thought to be splinting. Rx PT and OTC meds for control and feel this is helping.  Maintenance inhaler also discontinued and no worsening of shortness of breath. Has been using IS with volumes up to 1200cc as well. She reports a new problems that when she drinks and going down her windpipe. Associated with coughing.   04/22/24 Since our last visit she continues to have shortness of breath with activity and is noisy per her  husband. Recently having headaches and dizziness. Denies wheezing. Currently hoarse and clearing her throat. Activities including prolonged walking and walking upstairs exacerbates. She considers herself active with yardwork but no dedicated exercise. She stopped PT in the spring due to insurance. She reports abnormal sensation under her tongue and seems to radiate to an area on her left submandible with intermittent swelling and tenderness. She reports hx of OSA and currently on BiPAP. Last sleep test 04/17/24. She is compliant BiPAP five hours nightly and awakens due to nocturia.   Social History: Never smoker  Past Medical History:  Diagnosis Date   Abdominal pain of unknown cause 10/21/2015   Last Assessment & Plan:   Relevant Hx:  Course:  Daily Update:  Today's Plan:still following with Dr. Delphia for this and she has pending FU with him for it as well and she indicates that she is still having discomfort and on exam was having some today though maybe not as bad, reviewed with her that would not have her take more abx for this though.     Electronically signed by: Eleanor Rung Bro   Abdominal pain, right lower quadrant 04/01/2014   Abnormal cardiovascular stress test 03/09/2023   Acute infection of nasal sinus 04/01/2014   Acute on chronic diastolic (congestive) heart failure (HCC) 03/20/2023   Anxiety 04/01/2014   Apnea, sleep 04/01/2014   Arthritis    Knees   Arthritis, degenerative 04/01/2014   Asthma, mild intermittent 10/08/2015   Last Assessment & Plan:  Relevant Hx:  Course:  Daily Update:  Today's Plan:this is stable for her at this time and will follow along for her     Electronically signed by: Eleanor Merlynn Lady, NP  10/28/15 1205     Avitaminosis D 04/01/2014   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   Chronic kidney disease    kidney infection post colon surgery on Macrodantin    Chronic rhinitis 04/01/2014   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   Last Assessment & Plan:   Relevant Hx:  Course:  Daily Update:  Today's Plan:this is going to be checked for her fasting today on current dose of med     Electronically signed by: Eleanor Merlynn Lady, NP  10/28/15 1256     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   DD (diverticular disease) 04/01/2014   Overview:   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     Degeneration of intervertebral disc of lumbosacral region 04/01/2014   Overview:   Taking shot therapy with pain center     Diaphragmatic hernia 04/01/2014   Diverticular disease of left colon    Dyspnea on exertion over the past month and a half 03/02/2023   Essential (primary) hypertension 10/08/2015   Last Assessment & Plan:   Relevant Hx:  Course:  Daily Update:  Today's Plan:this is stable for her at this time and will follow her along     Electronically signed by: Eleanor Merlynn Lady, NP  10/28/15 1205     Fatigue 03/02/2023   Gastro-esophageal reflux disease without esophagitis 10/08/2015   Last Assessment & Plan:   Relevant Hx:  Course:  Daily Update:  Today's Plan:this is stable for her at this time and will follow along for her      Electronically signed by: Eleanor Merlynn Lady, NP  10/28/15 1211     Generalized OA 10/08/2015   Last Assessment & Plan:   Relevant Hx:  Course:  Daily Update:  Today's Plan:she has chronic pain which I belive is just that chronic pain from her OA but she has had multiple tick bites in the last 2 mnths and we did discuss her having labs which were drawn for her today for tick borne illness .     Electronically signed by: Eleanor Merlynn Lady, NP  01/11/16 1335     Gonalgia 04/01/2014   Headache, migraine 04/01/2014   Lumbar canal stenosis 08/14/2011   Last Assessment & Plan:   Relevant Hx:  Course:  Daily Update:  Today's Plan:she again is following with ortho for this and she is to have review with him in June for discussion of surgery     Electronically signed by: Eleanor Merlynn Lady, NP  10/28/15 1300     Lumbar foraminal stenosis 03/14/2016   Malaise and fatigue 10/08/2015   Last Assessment & Plan:   Relevant Hx:  Course:  Daily Update:  Today's Plan:update the labs as we discussed and she is having some increased anxiety worrying over her husband who is having some health issues right now and that could be an aggravating factor for her as well and she needs to address that with him and her concerns     Electronically signed by: Eleanor Merlynn Lady, NP  06/20/1   MRSA colonization 2009   Muscle ache 04/01/2014   Neuromuscular disorder (HCC)    Cervical disc   Osteopenia 04/01/2014   Seasonal allergies    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     Family  History  Problem Relation Age of Onset   Anesthesia problems Neg Hx    Colon cancer Neg Hx      Social History   Occupational History   Not on file  Tobacco Use   Smoking status: Never    Passive exposure: Never   Smokeless tobacco: Never  Vaping Use   Vaping status: Never Used  Substance and Sexual Activity   Alcohol use: No   Drug use: No   Sexual activity: Not on file    Allergies  Allergen Reactions   Sulfa Antibiotics Other (See Comments)    Hallucinations   Cortizone-10 [Hydrocortisone] Hives   Elemental Sulfur     Hallucinations    Celebrex [Celecoxib] Other (See Comments)    Increase in blood pressure.   Ciprofloxacin Rash   Levofloxacin Nausea And Vomiting   Metronidazole Nausea Only    Severe rash   Promethazine Hcl Other (See Comments)    Right muscle spasm.     Outpatient Medications Prior to Visit  Medication Sig Dispense Refill   acetaminophen  (TYLENOL  8 HOUR ARTHRITIS PAIN) 650 MG CR tablet Take 2,600 mg by mouth daily.     ALPRAZolam  (XANAX ) 0.5 MG tablet Take 0.5-1 mg by mouth at bedtime as needed for anxiety.     butalbital -acetaminophen -caffeine  (FIORICET) 50-325-40 MG tablet Take 1 tablet by mouth as needed for  headache.     Cholecalciferol  (VITAMIN D3) 3000 units TABS Take 3,000 Units by mouth daily.      cholestyramine  (QUESTRAN ) 4 g packet Take 1 packet (4 g total) by mouth daily. TAKE 2 HOURS BEFORE  OR AFTER ALL OTHER MEDICATIONS 90 each 3   estradiol (ESTRACE) 0.1 MG/GM vaginal cream Place 1 Applicatorful vaginally 3 (three) times a week.     fexofenadine (ALLEGRA) 180 MG tablet Take 180 mg by mouth daily.     fluticasone  (FLONASE ) 50 MCG/ACT nasal spray Place 2 sprays into both nostrils.     loperamide  (IMODIUM  A-D) 2 MG tablet Take 1-3 tablets by mouth as directed.     meloxicam  (MOBIC ) 7.5 MG tablet Take 7.5-15 mg by mouth daily.     methenamine (HIPREX) 1 g tablet Take 1 g by mouth 2 (two) times daily with a meal.     Misc Natural Products (OSTEO BI-FLEX TRIPLE STRENGTH PO) Take 1 tablet by mouth daily.     montelukast  (SINGULAIR ) 10 MG tablet Take 10 mg by mouth daily.     Multiple Vitamins-Minerals (MULTIVITAMINS THER. W/MINERALS) TABS Take 1 tablet by mouth daily.       loperamide  (IMODIUM ) 2 MG capsule Take 1 capsule (2 mg total) by mouth as needed for diarrhea or loose stools. (Patient not taking: Reported on 04/22/2024) 90 capsule 3   No facility-administered medications prior to visit.    Review of Systems  Constitutional:  Negative for chills, diaphoresis, fever, malaise/fatigue and weight loss.  HENT:  Negative for congestion.   Respiratory:  Positive for shortness of breath. Negative for cough, hemoptysis, sputum production and wheezing.   Cardiovascular:  Negative for chest pain, palpitations and leg swelling.     Objective:   Vitals:   04/22/24 0829  BP: 137/86  Pulse: 86  SpO2: 97%  Weight: 137 lb 11.2 oz (62.5 kg)  Height: 4' 9 (1.448 m)     SpO2: 97 % Physical Exam: General: Well-appearing, no acute distress HENT: Superior, AT, left submandible tenderness but no swelling appreciated Eyes: EOMI, no scleral icterus Respiratory: Clear to auscultation bilaterally.  No crackles, wheezing or rales Cardiovascular: RRR, -M/R/G, no JVD Extremities:-Edema,-tenderness Neuro: AAO x4, CNII-XII grossly intact Psych: Normal mood, normal affect  Data Reviewed:  Imaging: CTA 02/27/23 - neg for pulmonary embolism. Mild dependent and lower lobe atelectasis, mild chronic scarring/atelectasis of RML. No effusion. Unchanged right hemidiaphragm.  CXR 08/15/23 - Normal  PFT: None on file  Labs: CBC    Component Value Date/Time   WBC 8.6 03/20/2023 0635   RBC 3.99 03/20/2023 0635   HGB 12.6 03/20/2023 0635   HCT 37.5 03/20/2023 0635   PLT 269 03/20/2023 0635   MCV 94.0 03/20/2023 0635   MCH 31.6 03/20/2023 0635   MCHC 33.6 03/20/2023 0635   RDW 12.6 03/20/2023 0635   LYMPHSABS 2.5 03/07/2016 1615   MONOABS 0.4 03/07/2016 1615   EOSABS 0.1 03/07/2016 1615   BASOSABS 0.0 03/07/2016 1615   Absolute eos 08/15/23  Cardiology Coronary angiogram 03/20/2023 with normal coronaries,  TTE 03/20/23 normal LV function, regional WMA, moderate aymmetric LV H of basal septal segment     Assessment & Plan:   Discussion: 78 year old female never smoker with OSA, asthma, allergic rhinitis who presents for follow-up for shortness of breath. Her shortness of breath is suspected to be due to respiratory splinting due to rib pain. Improved ROM, shortness of breath with PT, IS and pain control. No worsening of symptoms of maintenance inhalers.   Shortness of breath - worsening in setting deconditioning. Unable to tolerate inhalers except for SABA but feels ineffective --REFER to pulmonary rehab --Trial 2 puffs of albuterol 15 min before exercise --Goal day 7000 steps  Left submandible swelling - suspect viral or inflammatory --Supportive care --If persistent, please discuss with your primary care. May need imaging for work-up  OSA on BiPAP --Wishing to change care from [facility] in [place] with Dr. Mardee to [facility] --Will need to  request records including clinic tests, sleep tests, pulmonary function tests   Health Maintenance Immunization History  Administered Date(s) Administered   Moderna Sars-Covid-2 Vaccination 08/22/2019, 09/24/2019   CT Lung Screen - not qualified  Orders Placed This Encounter  Procedures   AMB referral to pulmonary rehabilitation    Referral Priority:   Routine    Referral Type:   Consultation    Number of Visits Requested:   1  No orders of the defined types were placed in this encounter.   Return in about 2 months (around 06/22/2024).  I have spent a total time of 40-minutes on the day of the appointment including chart review, data review, collecting history, coordinating care and discussing medical diagnosis and plan with the patient/family. Past medical history, allergies, medications were reviewed. Pertinent imaging, labs and tests included in this note have been reviewed and interpreted independently by me.  Ranveer Wahlstrom Slater Staff, MD [facility] 04/22/2024 8:52 AM

## 2024-04-22 NOTE — Patient Instructions (Addendum)
 Shortness of breath - worsening in setting deconditioning. Unable to tolerate inhalers except for SABA but feels ineffective --REFER to pulmonary rehab --Trial 2 puffs of albuterol 15 min before exercise --Goal day 7000 steps  Left submandible swelling - suspect viral or inflammatory --Supportive care --If persistent, please discuss with your primary care. May need imaging for work-up  OSA on BiPAP --Wishing to change care from [facility] in [place] with Dr. Mardee to [facility] --Will need to request records including clinic tests, sleep tests, pulmonary function tests

## 2024-06-02 ENCOUNTER — Telehealth (HOSPITAL_BASED_OUTPATIENT_CLINIC_OR_DEPARTMENT_OTHER): Payer: Self-pay | Admitting: *Deleted

## 2024-06-02 ENCOUNTER — Telehealth (HOSPITAL_BASED_OUTPATIENT_CLINIC_OR_DEPARTMENT_OTHER): Payer: Self-pay

## 2024-06-02 NOTE — Telephone Encounter (Signed)
 Received fax confirmation from [facility] for the Pulmonary Rehab Referral that was requested .

## 2024-06-02 NOTE — Telephone Encounter (Signed)
 Copied from CRM [id number]. Topic: Referral - Status >> Jun 02, 2024  1:48 PM [name] wrote: Reason for CRM: Chelsea Gregory is calling from [facility] and is requesting Dr. Kassie to add a specific respiratory diagnosis to her referral for the pt (only listed SHORTNESS OF BREATH) and refax to fax [phone].

## 2024-06-02 NOTE — Addendum Note (Signed)
 Addended by: TRUDY WARREN CROME on: 06/02/2024 02:14 PM   Modules accepted: Orders

## 2024-08-21 ENCOUNTER — Encounter (HOSPITAL_BASED_OUTPATIENT_CLINIC_OR_DEPARTMENT_OTHER): Payer: Self-pay

## 2024-08-21 ENCOUNTER — Ambulatory Visit (HOSPITAL_BASED_OUTPATIENT_CLINIC_OR_DEPARTMENT_OTHER): Payer: Self-pay | Admitting: Family Medicine

## 2024-08-21 ENCOUNTER — Ambulatory Visit (INDEPENDENT_AMBULATORY_CARE_PROVIDER_SITE_OTHER): Admit: 2024-08-21 | Discharge: 2024-08-21 | Disposition: A | Discharge status: Home / Self Care | Admitting: Radiology

## 2024-08-21 ENCOUNTER — Other Ambulatory Visit (HOSPITAL_BASED_OUTPATIENT_CLINIC_OR_DEPARTMENT_OTHER): Payer: Self-pay

## 2024-08-21 ENCOUNTER — Ambulatory Visit (HOSPITAL_BASED_OUTPATIENT_CLINIC_OR_DEPARTMENT_OTHER)
Admission: RE | Admit: 2024-08-21 | Discharge: 2024-08-21 | Disposition: A | Discharge status: Home / Self Care | Source: Ambulatory Visit | Attending: Family Medicine | Admitting: Family Medicine

## 2024-08-21 VITALS — BP 116/67 | HR 86 | Temp 98.2°F | Resp 20

## 2024-08-21 DIAGNOSIS — R197 Diarrhea, unspecified: Secondary | ICD-10-CM | POA: Insufficient documentation

## 2024-08-21 DIAGNOSIS — R0602 Shortness of breath: Secondary | ICD-10-CM

## 2024-08-21 DIAGNOSIS — R051 Acute cough: Secondary | ICD-10-CM | POA: Diagnosis present

## 2024-08-21 DIAGNOSIS — R8290 Unspecified abnormal findings in urine: Secondary | ICD-10-CM | POA: Insufficient documentation

## 2024-08-21 DIAGNOSIS — N390 Urinary tract infection, site not specified: Secondary | ICD-10-CM | POA: Insufficient documentation

## 2024-08-21 DIAGNOSIS — R42 Dizziness and giddiness: Secondary | ICD-10-CM | POA: Insufficient documentation

## 2024-08-21 DIAGNOSIS — J208 Acute bronchitis due to other specified organisms: Secondary | ICD-10-CM | POA: Diagnosis not present

## 2024-08-21 DIAGNOSIS — R531 Weakness: Secondary | ICD-10-CM | POA: Diagnosis present

## 2024-08-21 DIAGNOSIS — B9789 Other viral agents as the cause of diseases classified elsewhere: Secondary | ICD-10-CM | POA: Diagnosis not present

## 2024-08-21 DIAGNOSIS — R2681 Unsteadiness on feet: Secondary | ICD-10-CM | POA: Diagnosis not present

## 2024-08-21 DIAGNOSIS — R0989 Other specified symptoms and signs involving the circulatory and respiratory systems: Secondary | ICD-10-CM | POA: Diagnosis not present

## 2024-08-21 LAB — POCT URINE DIPSTICK
Bilirubin, UA: NEGATIVE
Glucose, UA: NEGATIVE mg/dL
Ketones, POC UA: NEGATIVE mg/dL
Nitrite, UA: POSITIVE — AB
Protein Ur, POC: 30 mg/dL — AB
Spec Grav, UA: 1.025
Urobilinogen, UA: 0.2 U/dL
pH, UA: 5.5

## 2024-08-21 MED ORDER — ALBUTEROL SULFATE HFA 108 (90 BASE) MCG/ACT IN AERS
2.0000 | INHALATION_SPRAY | RESPIRATORY_TRACT | 0 refills | Status: AC | PRN
  Filled 2024-08-21: qty 6.7, 17d supply, fill #0

## 2024-08-21 MED ORDER — IPRATROPIUM-ALBUTEROL 0.5-2.5 (3) MG/3ML IN SOLN
3.0000 mL | Freq: Once | RESPIRATORY_TRACT | Status: AC
  Administered 2024-08-21: 3 mL via RESPIRATORY_TRACT

## 2024-08-21 MED ORDER — PREDNISONE 20 MG PO TABS
20.0000 mg | ORAL_TABLET | Freq: Every day | ORAL | 0 refills | Status: AC
  Filled 2024-08-21: qty 5, 5d supply, fill #0

## 2024-08-21 MED ORDER — CEPHALEXIN 500 MG PO CAPS
1000.0000 mg | ORAL_CAPSULE | Freq: Two times a day (BID) | ORAL | 0 refills | Status: AC
  Filled 2024-08-21: qty 40, 10d supply, fill #0

## 2024-08-21 MED ORDER — COMPACT SPACE CHAMBER DEVI
0 refills | Status: AC
  Filled 2024-08-21: qty 1, 1d supply, fill #0

## 2024-08-21 NOTE — Progress Notes (Signed)
 Negative chest x-ray.  Patient was given this report during her visit.

## 2024-08-21 NOTE — ED Provider Notes (Addendum)
 " Chelsea Gregory CARE    CSN: [id number] Arrival date & time: 08/21/24  9162      History   Chief Complaint Chief Complaint  Patient presents with   Cough    HPI Chelsea Gregory is a 79 y.o. female.   79 year old female with complaint of headache and cough that started on 08/16/2024.  She since developed chest congestion, tightness in her chest and shortness of breath.  Sometimes if she coughs a lot she feels a little lightheaded and she is feeling a little weaker since all the symptoms started.  She is concerned she might be developing bronchitis or pneumonia.   Cough Associated symptoms: rhinorrhea and shortness of breath   Associated symptoms: no chest pain, no chills, no ear pain, no fever, no rash and no sore throat     Past Medical History:  Diagnosis Date   Abdominal pain of unknown cause 10/21/2015   Last Assessment & Plan:   Relevant Hx:  Course:  Daily Update:  Today's Plan:still following with Dr. Delphia for this and she has pending FU with him for it as well and she indicates that she is still having discomfort and on exam was having some today though maybe not as bad, reviewed with her that would not have her take more abx for this though.     Electronically signed by: Chelsea Gregory   Abdominal pain, right lower quadrant 04/01/2014   Abnormal cardiovascular stress test 03/09/2023   Acute infection of nasal sinus 04/01/2014   Acute on chronic diastolic (congestive) heart failure (HCC) 03/20/2023   Anxiety 04/01/2014   Apnea, sleep 04/01/2014   Arthritis    Knees   Arthritis, degenerative 04/01/2014   Asthma, mild intermittent 10/08/2015   Last Assessment & Plan:   Relevant Hx:  Course:  Daily Update:  Today's Plan:this is stable for her at this time and will follow along for her     Electronically signed by: Chelsea Rung Lady, NP  10/28/15 1205     Avitaminosis D 04/01/2014   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   Chronic kidney disease    kidney  infection post colon surgery on Macrodantin    Chronic rhinitis 04/01/2014   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   Last Assessment & Plan:   Relevant Hx:  Course:  Daily Update:  Today's Plan:this is going to be checked for her fasting today on current dose of med     Electronically signed by: Chelsea Rung Lady, NP  10/28/15 1256     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   DD (diverticular disease) 04/01/2014   Overview:   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     Degeneration of intervertebral disc of lumbosacral region 04/01/2014   Overview:   Taking shot therapy with pain center     Diaphragmatic hernia 04/01/2014   Diverticular disease of left colon    Dyspnea on exertion over the past month and a half 03/02/2023   Essential (primary) hypertension 10/08/2015   Last Assessment & Plan:   Relevant Hx:  Course:  Daily Update:  Today's Plan:this is stable for her at this time and will follow her along     Electronically signed by: Chelsea Rung Lady, NP  10/28/15 1205     Fatigue 03/02/2023   Gastro-esophageal reflux disease without esophagitis 10/08/2015   Last Assessment & Plan:   Relevant Hx:  Course:  Daily Update:  Today's Plan:this is stable for her at this time and will follow along  for her      Electronically signed by: Chelsea Merlynn Lady, NP  10/28/15 1211     Generalized OA 10/08/2015   Last Assessment & Plan:   Relevant Hx:  Course:  Daily Update:  Today's Plan:she has chronic pain which I belive is just that chronic pain from her OA but she has had multiple tick bites in the last 2 mnths and we did discuss her having labs which were drawn for her today for tick borne illness .     Electronically signed by: Chelsea Merlynn Lady, NP  01/11/16 1335     Gonalgia 04/01/2014   Headache, migraine 04/01/2014   Lumbar canal stenosis 08/14/2011   Last Assessment & Plan:   Relevant Hx:  Course:  Daily Update:  Today's Plan:she again is following  with ortho for this and she is to have review with him in June for discussion of surgery     Electronically signed by: Chelsea Merlynn Lady, NP  10/28/15 1300     Lumbar foraminal stenosis 03/14/2016   Malaise and fatigue 10/08/2015   Last Assessment & Plan:   Relevant Hx:  Course:  Daily Update:  Today's Plan:update the labs as we discussed and she is having some increased anxiety worrying over her husband who is having some health issues right now and that could be an aggravating factor for her as well and she needs to address that with him and her concerns     Electronically signed by: Chelsea Merlynn Lady, NP  06/20/1   MRSA colonization 2009   Muscle ache 04/01/2014   Neuromuscular disorder (HCC)    Cervical disc   Osteopenia 04/01/2014   Seasonal allergies    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    Patient Active Problem List   Diagnosis Date Noted   Arthritis    Chronic kidney disease    Diverticular disease of left colon    Neuromuscular disorder (HCC)    Seasonal allergies    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   Abnormal cardiovascular stress test 03/09/2023   Dyspnea on exertion over the past month and a half 03/02/2023   Fatigue 03/02/2023   Lumbar foraminal stenosis 03/14/2016   Abdominal pain of unknown cause 10/21/2015   Gastro-esophageal reflux disease without esophagitis 10/08/2015   Essential (primary) hypertension 10/08/2015   Malaise and fatigue 10/08/2015   Asthma, mild intermittent 10/08/2015   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   Generalized OA 10/08/2015   Abdominal pain, right lower quadrant 04/01/2014   Acute infection of nasal sinus 04/01/2014   Anxiety 04/01/2014   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   Chronic rhinitis 04/01/2014   Degeneration of intervertebral disc of lumbosacral region 04/01/2014   Diaphragmatic hernia 04/01/2014   DD (diverticular disease) 04/01/2014    Gonalgia 04/01/2014   Headache, migraine 04/01/2014   Muscle ache 04/01/2014   Arthritis, degenerative 04/01/2014   Osteopenia 04/01/2014   Apnea, sleep 04/01/2014   Avitaminosis D 04/01/2014   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   Lumbar canal stenosis 08/14/2011   MRSA colonization 2009    Past Surgical History:  Procedure Laterality Date   ABDOMINAL HYSTERECTOMY  2006   ANTERIOR LAT LUMBAR FUSION Left 03/14/2016   Procedure: Extreme Lumbar Interbody Fusion Left Lumbar Two-Three,Lumbar Three-four,Lumbar Four-Five with Pedicle Screws;  Surgeon: Victory Gunnels, MD;  Location: [facility];  Service: Neurosurgery;  Laterality: Left;  left approach   BACK SURGERY  CERVICAL DISCECTOMY  2010   CHOLECYSTECTOMY  2000   COLON SURGERY  2009   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, aso  had left ovary removed    Colosotmy  2009   COLOSTOMY CLOSURE  2010   EYE SURGERY     Implant to tear duct   HERNIA REPAIR  12/2009   Right   HERNIA REPAIR  2012   Left   JOINT REPLACEMENT Left 2015   LEFT HEART CATH AND CORONARY ANGIOGRAPHY N/A 03/20/2023   Procedure: LEFT HEART CATH AND CORONARY ANGIOGRAPHY;  Surgeon: Dann Candyce RAMAN, MD;  Location: [facility];  Service: Cardiovascular;  Laterality: N/A;   LUMBAR LAMINECTOMY/DECOMPRESSION MICRODISCECTOMY  08/14/2011   Procedure: LUMBAR LAMINECTOMY/DECOMPRESSION MICRODISCECTOMY;  Surgeon: Victory DELENA Gunnels, MD;  Location: [facility];  Service: Neurosurgery;  Laterality: Left;  Left Lumbar three-four, Bilateral Lumbar four-five decompressive lumbar laminectomy   LUMBAR PERCUTANEOUS PEDICLE SCREW 3 LEVEL Left 03/14/2016   Procedure: LUMBAR PERCUTANEOUS PEDICLE SCREW 3 LEVEL;  Surgeon: Victory Gunnels, MD;  Location: [facility];  Service: Neurosurgery;  Laterality: Left;   TUBAL LIGATION  1968    OB History   No obstetric history on file.      Home Medications    Prior to Admission medications  Medication Sig  Start Date End Date Taking? Authorizing Provider  albuterol  (VENTOLIN  HFA) 108 (90 Base) MCG/ACT inhaler Inhale 2 puffs into the lungs every 4 (four) hours as needed for wheezing or shortness of breath. 08/21/24  Yes Ival Domino, FNP  ALPRAZolam  (XANAX ) 0.5 MG tablet Take 0.5-1 mg by mouth at bedtime as needed for anxiety.   Yes [provider]  butalbital -acetaminophen -caffeine  (FIORICET) 50-325-40 MG tablet Take 1 tablet by mouth as needed for headache.   Yes [provider]  cephALEXin  (KEFLEX ) 500 MG capsule Take 2 capsules (1,000 mg total) by mouth 2 (two) times daily for 7 days. 08/21/24 08/31/24 Yes Ival Domino, FNP  Cholecalciferol  (VITAMIN D3) 3000 units TABS Take 3,000 Units by mouth daily.    Yes [provider]  cholestyramine  (QUESTRAN ) 4 g packet Take 1 packet (4 g total) by mouth daily. TAKE 2 HOURS BEFORE  OR AFTER ALL OTHER MEDICATIONS 04/10/24  Yes Craig Alan SAUNDERS, PA-C  estradiol (ESTRACE) 0.1 MG/GM vaginal cream Place 1 Applicatorful vaginally 3 (three) times a week.   Yes [provider]  fexofenadine (ALLEGRA) 180 MG tablet Take 180 mg by mouth daily.   Yes [provider]  fluticasone  (FLONASE ) 50 MCG/ACT nasal spray Place 2 sprays into both nostrils.   Yes [provider]  losartan (COZAAR) 25 MG tablet Take 25 mg by mouth daily. 07/08/24 07/08/25 Yes [provider]  Multiple Vitamins-Minerals (MULTIVITAMINS THER. W/MINERALS) TABS Take 1 tablet by mouth daily.     Yes [provider]  predniSONE  (DELTASONE ) 20 MG tablet Take 1 tablet (20 mg total) by mouth daily with breakfast for 5 days. 08/21/24 08/26/24 Yes Ival Domino, FNP  Spacer/Aero-Holding Chambers (COMPACT SPACE CHAMBER) DEVI Use with the albuterol  inhaler 08/21/24  Yes Ival Domino, FNP  acetaminophen  (TYLENOL  8 HOUR ARTHRITIS PAIN) 650 MG CR tablet Take 2,600 mg by mouth daily.    [provider]  meloxicam  (MOBIC ) 7.5 MG tablet Take  7.5-15 mg by mouth daily. 09/29/15   [provider]  Misc Natural Products (OSTEO BI-FLEX TRIPLE STRENGTH PO) Take 1 tablet by mouth daily.    [provider]  montelukast  (SINGULAIR ) 10 MG tablet Take 10 mg  by mouth daily.    [provider]    Family History Family History  Problem Relation Age of Onset   Anesthesia problems Neg Hx    Colon cancer Neg Hx     Social History Social History[1]   Allergies   Sulfa antibiotics, Cortizone-10 [hydrocortisone], Elemental sulfur, Celebrex [celecoxib], Ciprofloxacin, Levofloxacin, Metronidazole, and Promethazine hcl   Review of Systems Review of Systems  Constitutional:  Negative for chills and fever.  HENT:  Positive for congestion, postnasal drip and rhinorrhea. Negative for ear pain and sore throat.   Eyes:  Negative for pain and visual disturbance.  Respiratory:  Positive for cough, chest tightness and shortness of breath.   Cardiovascular:  Negative for chest pain and palpitations.  Gastrointestinal:  Negative for abdominal pain, constipation, diarrhea, nausea and vomiting.  Genitourinary:  Negative for dysuria and hematuria.  Musculoskeletal:  Negative for arthralgias and back pain.  Skin:  Negative for color change and rash.  Neurological:  Positive for weakness and light-headedness. Negative for seizures and syncope.  All other systems reviewed and are negative.    Physical Exam Triage Vital Signs ED Triage Vitals  Encounter Vitals Group     BP 08/21/24 0849 116/67     Girls Systolic BP Percentile --      Girls Diastolic BP Percentile --      Boys Systolic BP Percentile --      Boys Diastolic BP Percentile --      Pulse Rate 08/21/24 0849 86     Resp 08/21/24 0849 20     Temp 08/21/24 0849 98.2 F (36.8 C)     Temp Source 08/21/24 0849 Oral     SpO2 08/21/24 0849 96 %     Weight --      Height --      Head Circumference --      Peak Flow --      Pain Score 08/21/24 0845 0     Pain Loc  --      Pain Education --      Exclude from Growth Chart --    Orthostatic VS for the past 24 hrs:  BP- Lying Pulse- Lying BP- Sitting Pulse- Sitting BP- Standing at 0 minutes Pulse- Standing at 0 minutes  08/21/24 0902 140/84 80 142/82 86 138/82 88    Updated Vital Signs BP 116/67 (BP Location: Left Arm)   Pulse 86   Temp 98.2 F (36.8 C) (Oral)   Resp 20   SpO2 96%   Visual Acuity Right Eye Distance:   Left Eye Distance:   Bilateral Distance:    Right Eye Near:   Left Eye Near:    Bilateral Near:     Physical Exam Vitals and nursing note reviewed.  Constitutional:      General: She is not in acute distress.    Appearance: She is well-developed. She is ill-appearing. She is not toxic-appearing or diaphoretic.  HENT:     Head: Normocephalic and atraumatic.     Right Ear: Hearing, tympanic membrane, ear canal and external ear normal.     Left Ear: Hearing, tympanic membrane, ear canal and external ear normal.     Nose: Congestion and rhinorrhea present. Rhinorrhea is clear.     Right Sinus: Maxillary sinus tenderness and frontal sinus tenderness present.     Left Sinus: Maxillary sinus tenderness and frontal sinus tenderness present.     Mouth/Throat:     Lips: Pink.     Mouth: Mucous  membranes are moist.     Pharynx: Uvula midline. No oropharyngeal exudate or posterior oropharyngeal erythema.     Tonsils: No tonsillar exudate.  Eyes:     Conjunctiva/sclera: Conjunctivae normal.     Pupils: Pupils are equal, round, and reactive to light.  Cardiovascular:     Rate and Rhythm: Normal rate and regular rhythm.     Heart sounds: S1 normal and S2 normal. No murmur heard. Pulmonary:     Effort: Pulmonary effort is normal. No respiratory distress.     Breath sounds: Examination of the right-upper field reveals wheezing and rhonchi. Examination of the left-upper field reveals wheezing and rhonchi. Examination of the right-middle field reveals wheezing and rhonchi. Examination  of the left-middle field reveals wheezing and rhonchi. Examination of the right-lower field reveals decreased breath sounds. Examination of the left-lower field reveals decreased breath sounds. Decreased breath sounds, wheezing (Mild inspiratory wheezes in the upper and middle lobes) and rhonchi (Mild rhonchi in upper and middle lobes) present. No rales.     Comments: Reassessment after DuoNeb treatment: Breath sounds are clear after DuoNeb treatment.  Oxygen saturation is 96-97 percent on room air. Abdominal:     General: Bowel sounds are normal.     Palpations: Abdomen is soft.     Tenderness: There is no abdominal tenderness.  Musculoskeletal:        General: No swelling.     Cervical back: Neck supple.  Lymphadenopathy:     Head:     Right side of head: No submental, submandibular, tonsillar, preauricular or posterior auricular adenopathy.     Left side of head: No submental, submandibular, tonsillar, preauricular or posterior auricular adenopathy.     Cervical: No cervical adenopathy.     Right cervical: No superficial cervical adenopathy.    Left cervical: No superficial cervical adenopathy.  Skin:    General: Skin is warm and dry.     Capillary Refill: Capillary refill takes less than 2 seconds.     Findings: No rash.  Neurological:     Mental Status: She is alert and oriented to person, place, and time.     Cranial Nerves: Cranial nerves 2-12 are intact.     Sensory: Sensation is intact.     Motor: Weakness (generalized but no hemiplegic) present.     Coordination: Coordination is intact.     Gait: Gait abnormal (unsteady gait - patient is having some weakness and balance issues).  Psychiatric:        Mood and Affect: Mood normal.      UC Treatments / Results  Labs (all labs ordered are listed, but only abnormal results are displayed) Labs Reviewed  POCT URINE DIPSTICK - Abnormal; Notable for the following components:      Result Value   Clarity, UA cloudy (*)     Blood, UA trace-intact (*)    Protein Ur, POC =30 (*)    Nitrite, UA Positive (*)    Leukocytes, UA Trace (*)    All other components within normal limits  URINE CULTURE   Comprehensive Metabolic Panel: 07/08/24: Order: [id number] Component Ref Range & Units 1 mo ago  Sodium 136 - 145 mmol/L 142  Potassium 3.5 - 5.1 mmol/L 4.2  Comment: NO VISIBLE HEMOLYSIS  Chloride 98 - 107 mmol/L 105  CO2 21 - 31 mmol/L 27  Comment: High lactate dehydrogenase (LDH) concentrations in patient samples may cause falsely increased bicarbonate results. If markedly elevated LDH levels are suspected, please assess results in  conjunction with patient's LDH values.  Anion Gap 6 - 14 mmol/L 10  Glucose, Random 70 - 99 mg/dL 84  Blood Urea Nitrogen (BUN) 7 - 25 mg/dL 18  Creatinine 9.39 - 8.79 mg/dL 9.25  eGFR >40 fO/fpw/8.26f7 82  Comment: GFR estimated by CKD-EPI equations(NKF 2021).  Recommend confirmation of Cr-based eGFR by using Cys-based eGFR and other filtration markers (if applicable) in complex cases and clinical decision-making, as needed.  Albumin 3.5 - 5.7 g/dL 4.7  Total Protein 6.4 - 8.9 g/dL 7.1  Bilirubin, Total 0.3 - 1.0 mg/dL 0.4  Alkaline Phosphatase (ALP) 34 - 104 U/L 65  Aspartate Aminotransferase (AST) 13 - 39 U/L 20  Alanine Aminotransferase (ALT) 7 - 52 U/L 25  Calcium  8.6 - 10.3 mg/dL 9.5  BUN/Creatinine Ratio   Comment: Creatinine is normal, ratio is not clinically indicated.  Resulting Agency [facility](CLIA# [id number])   EKG   Radiology DG Chest 2 View Result Date: 08/21/2024 CLINICAL DATA:  Cough and congestion. EXAM: CHEST - 2 VIEW COMPARISON:  08/15/2023 FINDINGS: The heart size and mediastinal contours are within normal limits. Stable tortuosity of the descending thoracic aorta. There is no evidence of pulmonary edema, consolidation, pneumothorax, nodule or pleural fluid. The visualized skeletal structures are  unremarkable. IMPRESSION: No active cardiopulmonary disease. Electronically Signed   By: Marcey Moan M.D.   On: 08/21/2024 10:44    Procedures Procedures (including critical care time)  Medications Ordered in UC Medications  ipratropium-albuterol  (DUONEB) 0.5-2.5 (3) MG/3ML nebulizer solution 3 mL (3 mLs Nebulization Given 08/21/24 0936)    Initial Impression / Assessment and Plan / UC Course  I have reviewed the triage vital signs and the nursing notes.  Pertinent labs & imaging results that were available during my care of the patient were reviewed by me and considered in my medical decision making (see chart for details).  Plan of Care (see discharge instructions for additional patient precautions and education): Acute viral bronchitis with cough, weakness, shortness of breath: Significant improvement in breath sounds after DuoNeb treatment.  Chest x-ray is slightly hazy but no consolidation.  The chest x-ray is most consistent with acute bronchitis.  Prednisone  20 mg daily for 5 days.  Albuterol  inhaler with spacer, 2 puffs, every 4 hours as needed for wheezing.  Get plenty of fluids and rest.  Patient has allergies to cough syrup so she will use an over-the-counter cough syrup that she does well with.  UTI with weakness: Urinalysis is abnormal.  Culture sent.  Will adjust the plan of care, if needed once the culture results.  Cephalexin  500 mg, 2 pills twice daily for 10 days.  Lightheadedness and weakness: Neurologic exam was normal.  Orthostatic pulse and pressure were normal.  I believe the patient might be mildly dehydrated and may also be impacted by her undetected urinary tract infection.  Encouraged good fluid intake and caution with movement.  Follow-up if symptoms do not improve, worsen or new symptoms occur.  See discharge instructions for signs and symptoms of worsening condition and reasons to go to an emergency room.  I reviewed the plan of care with the patient and/or  the patient's guardian.  The patient and/or guardian had time to ask questions and acknowledged that the questions were answered.   I spent over 45 minutes on patient care, including face to face time and care planning/patient management.  Additional time in collection of history, assessment and reassessment of her physical condition, independent review  of imaging prior to radiology review, discussion of treatment plan and patient education. Final Clinical Impressions(s) / UC Diagnoses   Final diagnoses:  Lightheadedness  Acute cough  Weakness  Diarrhea, unspecified type  Shortness of breath  Urinary tract infection without hematuria, site unspecified  Acute viral bronchitis     Discharge Instructions      Acute viral bronchitis with cough, weakness, shortness of breath: Significant improvement in breath sounds after DuoNeb treatment.  Chest x-ray is slightly hazy but no consolidation.  The chest x-ray is most consistent with acute bronchitis.  Prednisone  20 mg daily for 5 days.  Albuterol  inhaler with spacer, 2 puffs, every 4 hours as needed for wheezing.  Get plenty of fluids and rest.  Patient has allergies to cough syrup so she will use an over-the-counter cough syrup that she does well with.  UTI with weakness: Urinalysis is abnormal.  Culture sent.  Will adjust the plan of care, if needed once the culture results.  Cephalexin  500 mg, 2 pills twice daily for 10 days.  Lightheadedness and weakness: Neurologic exam was normal.  Orthostatic pulse and pressure were normal.  I believe the patient might be mildly dehydrated and may also be impacted by her undetected urinary tract infection.  Encouraged good fluid intake and caution with movement.  Follow-up if symptoms do not improve, worsen or new symptoms occur.  See below for signs and symptoms of worsening condition and reasons to go to an emergency room.  Get help right away if you: Cough up blood. Feel pain in your chest. Have  severe shortness of breath. Faint or keep feeling like you are going to faint. Have a severe headache. Have a fever or chills that get worse.   You have a fever over 101.5 that will not respond to acetaminophen  or ibuprofen These symptoms may represent a serious problem that is an emergency. Do not wait to see if the symptoms will go away. Get medical help right away. Call your local emergency services (911 in the [place] Do not drive yourself to the hospital.     ED Prescriptions     Medication Sig Dispense Auth. Provider   albuterol  (VENTOLIN  HFA) 108 (90 Base) MCG/ACT inhaler Inhale 2 puffs into the lungs every 4 (four) hours as needed for wheezing or shortness of breath. 6.7 g Ival Domino, FNP   Spacer/Aero-Holding Chambers (COMPACT SPACE CHAMBER) DEVI Use with the albuterol  inhaler 1 each Ival Domino, FNP   cephALEXin  (KEFLEX ) 500 MG capsule Take 2 capsules (1,000 mg total) by mouth 2 (two) times daily for 7 days. 40 capsule Ival Domino, FNP   predniSONE  (DELTASONE ) 20 MG tablet Take 1 tablet (20 mg total) by mouth daily with breakfast for 5 days. 5 tablet Cannie Muckle, FNP      PDMP not reviewed this encounter.    Ival Domino, FNP 08/21/24 1014     [1]  Social History Tobacco Use   Smoking status: Never    Passive exposure: Never   Smokeless tobacco: Never  Vaping Use   Vaping status: Never Used  Substance Use Topics   Alcohol use: No   Drug use: No     Ival Domino, FNP 08/21/24 1055  "

## 2024-08-21 NOTE — Discharge Instructions (Addendum)
 Acute viral bronchitis with cough, weakness, shortness of breath: Significant improvement in breath sounds after DuoNeb treatment.  Chest x-ray is slightly hazy but no consolidation.  The chest x-ray is most consistent with acute bronchitis.  Prednisone  20 mg daily for 5 days.  Albuterol  inhaler with spacer, 2 puffs, every 4 hours as needed for wheezing.  Get plenty of fluids and rest.  Patient has allergies to cough syrup so she will use an over-the-counter cough syrup that she does well with.  UTI with weakness: Urinalysis is abnormal.  Culture sent.  Will adjust the plan of care, if needed once the culture results.  Cephalexin  500 mg, 2 pills twice daily for 10 days.  Lightheadedness and weakness: Neurologic exam was normal.  Orthostatic pulse and pressure were normal.  I believe the patient might be mildly dehydrated and may also be impacted by her undetected urinary tract infection.  Encouraged good fluid intake and caution with movement.  Follow-up if symptoms do not improve, worsen or new symptoms occur.  See below for signs and symptoms of worsening condition and reasons to go to an emergency room.  Get help right away if you: Cough up blood. Feel pain in your chest. Have severe shortness of breath. Faint or keep feeling like you are going to faint. Have a severe headache. Have a fever or chills that get worse.   You have a fever over 101.5 that will not respond to acetaminophen  or ibuprofen These symptoms may represent a serious problem that is an emergency. Do not wait to see if the symptoms will go away. Get medical help right away. Call your local emergency services (911 in the [place] Do not drive yourself to the hospital.

## 2024-08-21 NOTE — ED Triage Notes (Signed)
 Pt reports on Saturday she had a headache, chest congestion, cough, tightness in chest, and shortness of breath started after. Pt reports weakness and lightheaded also

## 2024-08-24 LAB — URINE CULTURE: Culture: >=100000 — AB

## 2024-08-26 NOTE — Progress Notes (Signed)
 Culture is positive.  Complete the cephalexin .  Follow-up as needed.  Patient updated on her results.

## 2024-11-24 ENCOUNTER — Ambulatory Visit (HOSPITAL_BASED_OUTPATIENT_CLINIC_OR_DEPARTMENT_OTHER): Admitting: Pulmonary Disease
